# Patient Record
Sex: Female | Born: 1945 | Race: White | Hispanic: No | Marital: Married | Smoking: Former smoker
Health system: Southern US, Community
[De-identification: ages and names within clinical notes are randomized; demographics above are authoritative.]

## PROBLEM LIST (undated history)

## (undated) DIAGNOSIS — F329 Major depressive disorder, single episode, unspecified: Secondary | ICD-10-CM

## (undated) DIAGNOSIS — Z970 Presence of artificial eye: Secondary | ICD-10-CM

## (undated) DIAGNOSIS — R05 Cough: Secondary | ICD-10-CM

## (undated) DIAGNOSIS — F32A Depression, unspecified: Secondary | ICD-10-CM

## (undated) DIAGNOSIS — Z87442 Personal history of urinary calculi: Secondary | ICD-10-CM

## (undated) DIAGNOSIS — J302 Other seasonal allergic rhinitis: Secondary | ICD-10-CM

## (undated) DIAGNOSIS — R058 Other specified cough: Secondary | ICD-10-CM

## (undated) HISTORY — PX: TONSILLECTOMY: SUR1361

## (undated) HISTORY — DX: Major depressive disorder, single episode, unspecified: F32.9

## (undated) HISTORY — PX: LITHOTRIPSY: SUR834

## (undated) HISTORY — PX: PARTIAL HYSTERECTOMY: SHX80

## (undated) HISTORY — PX: CATARACT EXTRACTION: SUR2

## (undated) HISTORY — DX: Depression, unspecified: F32.A

## (undated) HISTORY — PX: EYE SURGERY: SHX253

---

## 1998-07-09 ENCOUNTER — Encounter: Payer: Self-pay | Admitting: Urology

## 1998-07-09 ENCOUNTER — Ambulatory Visit (HOSPITAL_BASED_OUTPATIENT_CLINIC_OR_DEPARTMENT_OTHER): Admission: RE | Admit: 1998-07-09 | Discharge: 1998-07-09 | Payer: Self-pay | Admitting: Urology

## 1998-09-22 ENCOUNTER — Encounter: Payer: Self-pay | Admitting: Gastroenterology

## 1998-09-22 ENCOUNTER — Ambulatory Visit (HOSPITAL_COMMUNITY): Admission: RE | Admit: 1998-09-22 | Discharge: 1998-09-22 | Payer: Self-pay | Admitting: Gastroenterology

## 1999-02-17 ENCOUNTER — Encounter: Payer: Self-pay | Admitting: Urology

## 1999-02-17 ENCOUNTER — Ambulatory Visit (HOSPITAL_COMMUNITY): Admission: RE | Admit: 1999-02-17 | Discharge: 1999-02-17 | Payer: Self-pay | Admitting: Urology

## 1999-12-28 ENCOUNTER — Encounter: Payer: Self-pay | Admitting: Gynecology

## 1999-12-28 ENCOUNTER — Encounter: Admission: RE | Admit: 1999-12-28 | Discharge: 1999-12-28 | Payer: Self-pay | Admitting: Gynecology

## 2012-05-01 ENCOUNTER — Encounter (HOSPITAL_BASED_OUTPATIENT_CLINIC_OR_DEPARTMENT_OTHER): Payer: Self-pay

## 2012-05-01 ENCOUNTER — Emergency Department (HOSPITAL_BASED_OUTPATIENT_CLINIC_OR_DEPARTMENT_OTHER): Payer: No Typology Code available for payment source

## 2012-05-01 ENCOUNTER — Emergency Department (HOSPITAL_BASED_OUTPATIENT_CLINIC_OR_DEPARTMENT_OTHER)
Admission: EM | Admit: 2012-05-01 | Discharge: 2012-05-01 | Disposition: A | Discharge status: Home / Self Care | Payer: No Typology Code available for payment source | Attending: Emergency Medicine | Admitting: Emergency Medicine

## 2012-05-01 DIAGNOSIS — S161XXA Strain of muscle, fascia and tendon at neck level, initial encounter: Secondary | ICD-10-CM

## 2012-05-01 DIAGNOSIS — Z79899 Other long term (current) drug therapy: Secondary | ICD-10-CM | POA: Insufficient documentation

## 2012-05-01 DIAGNOSIS — T07XXXA Unspecified multiple injuries, initial encounter: Secondary | ICD-10-CM | POA: Insufficient documentation

## 2012-05-01 DIAGNOSIS — S139XXA Sprain of joints and ligaments of unspecified parts of neck, initial encounter: Secondary | ICD-10-CM | POA: Insufficient documentation

## 2012-05-01 DIAGNOSIS — Y9389 Activity, other specified: Secondary | ICD-10-CM | POA: Insufficient documentation

## 2012-05-01 DIAGNOSIS — J309 Allergic rhinitis, unspecified: Secondary | ICD-10-CM | POA: Insufficient documentation

## 2012-05-01 HISTORY — DX: Other seasonal allergic rhinitis: J30.2

## 2012-05-01 MED ORDER — HYDROCODONE-ACETAMINOPHEN 5-325 MG PO TABS: 2.0000 | ORAL_TABLET | ORAL | Status: DC | PRN
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
## 2012-05-01 NOTE — ED Provider Notes (Signed)
Medical screening examination/treatment/procedure(s) were performed by non-physician practitioner and as supervising physician I was immediately available for consultation/collaboration.  Doug Sou, MD 05/01/12 1650
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
## 2012-10-09 ENCOUNTER — Other Ambulatory Visit: Payer: Self-pay | Admitting: Internal Medicine

## 2012-10-09 DIAGNOSIS — M545 Low back pain, unspecified: Secondary | ICD-10-CM

## 2012-10-16 ENCOUNTER — Ambulatory Visit
Admission: RE | Admit: 2012-10-16 | Discharge: 2012-10-16 | Disposition: A | Discharge status: Home / Self Care | Payer: Self-pay | Source: Ambulatory Visit | Attending: Internal Medicine | Admitting: Internal Medicine

## 2012-10-16 DIAGNOSIS — M545 Low back pain: Secondary | ICD-10-CM

## 2012-11-14 ENCOUNTER — Other Ambulatory Visit: Payer: Self-pay | Admitting: Neurosurgery

## 2012-11-14 DIAGNOSIS — M47816 Spondylosis without myelopathy or radiculopathy, lumbar region: Secondary | ICD-10-CM

## 2012-11-21 ENCOUNTER — Ambulatory Visit
Admission: RE | Admit: 2012-11-21 | Discharge: 2012-11-21 | Disposition: A | Discharge status: Home / Self Care | Payer: Medicare Other | Source: Ambulatory Visit | Attending: Neurosurgery | Admitting: Neurosurgery

## 2012-11-21 VITALS — BP 120/59 | HR 75

## 2012-11-21 DIAGNOSIS — M47816 Spondylosis without myelopathy or radiculopathy, lumbar region: Secondary | ICD-10-CM

## 2012-11-21 MED ORDER — IOHEXOL 180 MG/ML  SOLN
1.0000 mL | Freq: Once | INTRAMUSCULAR | Status: AC | PRN
  Administered 2012-11-21: 1 mL via EPIDURAL

## 2012-11-21 MED ORDER — METHYLPREDNISOLONE ACETATE 40 MG/ML INJ SUSP (RADIOLOG
120.0000 mg | Freq: Once | INTRAMUSCULAR | Status: AC
  Administered 2012-11-21: 120 mg via EPIDURAL

## 2012-12-19 ENCOUNTER — Other Ambulatory Visit: Payer: Self-pay | Admitting: Neurosurgery

## 2012-12-19 DIAGNOSIS — M47816 Spondylosis without myelopathy or radiculopathy, lumbar region: Secondary | ICD-10-CM

## 2012-12-25 ENCOUNTER — Other Ambulatory Visit: Payer: Self-pay | Admitting: Neurosurgery

## 2012-12-25 ENCOUNTER — Ambulatory Visit
Admission: RE | Admit: 2012-12-25 | Discharge: 2012-12-25 | Disposition: A | Discharge status: Home / Self Care | Payer: Medicare Other | Source: Ambulatory Visit | Attending: Neurosurgery | Admitting: Neurosurgery

## 2012-12-25 VITALS — BP 120/70 | HR 82

## 2012-12-25 DIAGNOSIS — R839 Unspecified abnormal finding in cerebrospinal fluid: Secondary | ICD-10-CM

## 2012-12-25 DIAGNOSIS — M47816 Spondylosis without myelopathy or radiculopathy, lumbar region: Secondary | ICD-10-CM

## 2012-12-25 MED ORDER — IOHEXOL 180 MG/ML  SOLN
1.0000 mL | Freq: Once | INTRAMUSCULAR | Status: AC | PRN
  Administered 2012-12-25: 1 mL via EPIDURAL

## 2012-12-25 MED ORDER — METHYLPREDNISOLONE ACETATE 40 MG/ML INJ SUSP (RADIOLOG
120.0000 mg | Freq: Once | INTRAMUSCULAR | Status: AC
  Administered 2012-12-25: 120 mg via EPIDURAL

## 2013-03-16 ENCOUNTER — Other Ambulatory Visit: Payer: Self-pay | Admitting: Neurosurgery

## 2013-03-16 DIAGNOSIS — M4712 Other spondylosis with myelopathy, cervical region: Secondary | ICD-10-CM

## 2013-03-16 DIAGNOSIS — M47817 Spondylosis without myelopathy or radiculopathy, lumbosacral region: Secondary | ICD-10-CM

## 2013-03-20 ENCOUNTER — Ambulatory Visit
Admission: RE | Admit: 2013-03-20 | Discharge: 2013-03-20 | Disposition: A | Discharge status: Home / Self Care | Payer: Medicare Other | Source: Ambulatory Visit | Attending: Neurosurgery | Admitting: Neurosurgery

## 2013-03-20 DIAGNOSIS — M4712 Other spondylosis with myelopathy, cervical region: Secondary | ICD-10-CM

## 2013-06-12 ENCOUNTER — Encounter (HOSPITAL_COMMUNITY): Payer: Self-pay | Admitting: Pharmacy Technician

## 2013-06-12 ENCOUNTER — Other Ambulatory Visit: Payer: Self-pay | Admitting: Orthopaedic Surgery

## 2013-06-18 NOTE — Pre-Procedure Instructions (Signed)
Courtney PotashJean R Wolf  06/18/2013   Your procedure is scheduled on:  Tuesday, January 13.  [facility], Main Entrance Jul[facility] Rainier/Entrance "A" at 10:30 AM.  Call this number if you have problems the morning of surgery: [phone]   Remember:   Do not eat food or drink liquids after midnight, Monday, January 12.   Take these medicines the morning of surgery with A SIP OF WATER: fexofenadine (ALLEGRA).  Take if needed:HYDROcodone-acetaminophen (NORCO/VICODIN).   Do not wear jewelry, make-up or nail polish.  Do not wear lotions, powders, or perfumes. You may wear deodorant.  Do not shave 48 hours prior to surgery.   Do not bring valuables to the hospital.  [facility] is not responsible for any belongings or valuables.               Contacts, dentures or bridgework may not be worn into surgery.  Leave suitcase in the car. After surgery it may be brought to your room.  For patients admitted to the hospital, discharge time is determined by your treatment team.                 Special Instructions: Shower using CHG 2 nights before surgery and the night before surgery.  If you shower the day of surgery use CHG.  Use special wash - you have one bottle of CHG for all showers.  You should use approximately 1/3 of the bottle for each shower.   Please read over the following fact sheets that you were given: Pain Booklet, Coughing and Deep Breathing, Blood Transfusion Information and Surgical Site Infection Prevention

## 2013-06-19 ENCOUNTER — Encounter (HOSPITAL_COMMUNITY): Payer: Self-pay

## 2013-06-19 ENCOUNTER — Encounter (HOSPITAL_COMMUNITY)
Admission: RE | Admit: 2013-06-19 | Discharge: 2013-06-19 | Disposition: A | Discharge status: Home / Self Care | Payer: Medicare Other | Source: Ambulatory Visit | Attending: Orthopaedic Surgery | Admitting: Orthopaedic Surgery

## 2013-06-19 DIAGNOSIS — Z01818 Encounter for other preprocedural examination: Secondary | ICD-10-CM | POA: Insufficient documentation

## 2013-06-19 DIAGNOSIS — Z01811 Encounter for preprocedural respiratory examination: Secondary | ICD-10-CM | POA: Insufficient documentation

## 2013-06-19 DIAGNOSIS — Z01810 Encounter for preprocedural cardiovascular examination: Secondary | ICD-10-CM | POA: Insufficient documentation

## 2013-06-19 DIAGNOSIS — Z01812 Encounter for preprocedural laboratory examination: Secondary | ICD-10-CM | POA: Insufficient documentation

## 2013-06-19 HISTORY — DX: Personal history of urinary calculi: Z87.442

## 2013-06-19 HISTORY — DX: Cough: R05

## 2013-06-19 HISTORY — DX: Other specified cough: R05.8

## 2013-06-19 LAB — URINALYSIS, ROUTINE W REFLEX MICROSCOPIC
Bilirubin Urine: NEGATIVE
GLUCOSE, UA: NEGATIVE mg/dL
Hgb urine dipstick: NEGATIVE
KETONES UR: NEGATIVE mg/dL
LEUKOCYTES UA: NEGATIVE
Nitrite: NEGATIVE
PROTEIN: NEGATIVE mg/dL
Specific Gravity, Urine: 1.025 (ref 1.005–1.030)
Urobilinogen, UA: 1 mg/dL (ref 0.0–1.0)
pH: 6.5 (ref 5.0–8.0)

## 2013-06-19 LAB — CBC WITH DIFFERENTIAL/PLATELET
BASOS ABS: 0.1 10*3/uL (ref 0.0–0.1)
BASOS PCT: 1 % (ref 0–1)
Eosinophils Absolute: 0 10*3/uL (ref 0.0–0.7)
Eosinophils Relative: 0 % (ref 0–5)
HCT: 45.8 % (ref 36.0–46.0)
Hemoglobin: 16 g/dL — ABNORMAL HIGH (ref 12.0–15.0)
LYMPHS PCT: 15 % (ref 12–46)
Lymphs Abs: 2 10*3/uL (ref 0.7–4.0)
MCH: 31.8 pg (ref 26.0–34.0)
MCHC: 34.9 g/dL (ref 30.0–36.0)
MCV: 91.1 fL (ref 78.0–100.0)
MONOS PCT: 6 % (ref 3–12)
Monocytes Absolute: 0.8 10*3/uL (ref 0.1–1.0)
Neutro Abs: 10.2 10*3/uL — ABNORMAL HIGH (ref 1.7–7.7)
Neutrophils Relative %: 78 % — ABNORMAL HIGH (ref 43–77)
Platelets: 345 10*3/uL (ref 150–400)
RBC: 5.03 MIL/uL (ref 3.87–5.11)
RDW: 13.2 % (ref 11.5–15.5)
WBC: 13.1 10*3/uL — AB (ref 4.0–10.5)

## 2013-06-19 LAB — BASIC METABOLIC PANEL
BUN: 11 mg/dL (ref 6–23)
CO2: 21 mEq/L (ref 19–32)
Calcium: 9.3 mg/dL (ref 8.4–10.5)
Chloride: 101 mEq/L (ref 96–112)
Creatinine, Ser: 0.74 mg/dL (ref 0.50–1.10)
GFR calc non Af Amer: 85 mL/min — ABNORMAL LOW (ref >90)
Glucose, Bld: 100 mg/dL — ABNORMAL HIGH (ref 70–99)
POTASSIUM: 4.4 meq/L (ref 3.7–5.3)
SODIUM: 137 meq/L (ref 137–147)

## 2013-06-19 LAB — ABO/RH: ABO/RH(D): O POS

## 2013-06-19 LAB — PROTIME-INR
INR: 0.93 (ref 0.00–1.49)
Prothrombin Time: 12.3 seconds (ref 11.6–15.2)

## 2013-06-19 LAB — TYPE AND SCREEN
ABO/RH(D): O POS
Antibody Screen: NEGATIVE

## 2013-06-19 LAB — SURGICAL PCR SCREEN
MRSA, PCR: NEGATIVE
STAPHYLOCOCCUS AUREUS: NEGATIVE

## 2013-06-19 LAB — APTT: aPTT: 28 seconds (ref 24–37)

## 2013-06-19 NOTE — Pre-Procedure Instructions (Signed)
Ulla PotashJean R Christofferson  06/19/2013   Your procedure is scheduled on:    Tuesday  06/26/12  [facility]  2 * 3 at 1030 AM.  Call this number if you have problems the morning of surgery: [phone]   Remember:   Do not eat food or drink liquids after midnight.   Take these medicines the morning of surgery with A SIP OF WATER:  ALLEGRA, HYDROCODONE IF NEEDED   Do not wear jewelry, make-up or nail polish.  Do not wear lotions, powders, or perfumes. You may wear deodorant.  Do not shave 48 hours prior to surgery. Men may shave face and neck.  Do not bring valuables to the hospital.  [facility] is not responsible                  for any belongings or valuables.               Contacts, dentures or bridgework may not be worn into surgery.  Leave suitcase in the car. After surgery it may be brought to your room.  For patients admitted to the hospital, discharge time is determined by your                treatment team.               Patients discharged the day of surgery will not be allowed to drive  home.  Name and phone number of your driver:  Special Instructions: Shower using CHG 2 nights before surgery and the night before surgery.  If you shower the day of surgery use CHG.  Use special wash - you have one bottle of CHG for all showers.  You should use approximately 1/3 of the bottle for each shower.   Please read over the following fact sheets that you were given: Pain Booklet, Coughing and Deep Breathing, Blood Transfusion Information, Total Joint Packet, MRSA Information and Surgical Site Infection Prevention

## 2013-06-20 ENCOUNTER — Other Ambulatory Visit: Payer: Self-pay | Admitting: Orthopaedic Surgery

## 2013-06-20 NOTE — Progress Notes (Signed)
Anesthesia Chart Review:  Patient is a 68 year old female scheduled for right TKA on 06/26/13 by Dr. Jerl Santosalldorf.  History noted.  PCP is listed as Dr. Shary DecampGrisso.  EKG on 06/19/13 showed NSR.  CXR on 06/19/13 showed: No acute cardiopulmonary process. Possible nodular opacities within the left and right lung base. While these may be secondary to overlapping structures a true underlying nodule is not excluded. This can be correlated with chest CT in the nonacute setting. (I spoke with Agustin CreeKathy Blume at Dr. Nolon Nationsalldorf's office.  He plans for her to get a chest CT on POD#1 while she is in the hospital.)  Preoperative labs noted. Kathy notified of WBC of 13.1.  UA is WNL.  Anticipate that she can proceed from an anesthesia standpoint.  Velna Ochsllison Eljay Lave, PA-C [facility]Anesthesiology Phone [phone] 06/20/2013 3:30 PM

## 2013-06-22 NOTE — H&P (Signed)
TOTAL KNEE ADMISSION H&P  Patient is being admitted for right total knee arthroplasty.  Subjective:  Chief Complaint:right knee pain.  HPI: [name], 68 y.o. female, has a history of pain and functional disability in the right knee due to arthritis and has failed non-surgical conservative treatments for greater than 12 weeks to includeNSAID's and/or analgesics, corticosteriod injections, flexibility and strengthening excercises, weight reduction as appropriate and activity modification.  Onset of symptoms was gradual, starting 9 years ago with gradually worsening course since that time. The patient noted prior procedures on the knee to include  arthroscopy on the right knee(s).  Patient currently rates pain in the right knee(s) at 9 out of 10 with activity. Patient has night pain, worsening of pain with activity and weight bearing, pain that interferes with activities of daily living, pain with passive range of motion and crepitus.  Patient has evidence of subchondral sclerosis, periarticular osteophytes and joint space narrowing by imaging studies. This patient has had previous knee arthroscopy.. There is no active infection.  There are no active problems to display for this patient.  Past Medical History  Diagnosis Date  . Seasonal allergies   . Cough productive of clear sputum   . History of kidney stones     15 YRS AGO     Past Surgical History  Procedure Laterality Date  . Abdominal hysterectomy    . Knee surgery    . Eye surgery    . Tonsillectomy    . Lithotripsy      No prescriptions prior to admission   No Known Allergies  History  Substance Use Topics  . Smoking status: Former Games developermoker  . Smokeless tobacco: Never Used  . Alcohol Use: Yes     Comment: occasional    No family history on file.   Review of Systems  Constitutional: Negative.   HENT: Negative.   Eyes: Negative.   Respiratory: Negative.   Cardiovascular: Negative.   Gastrointestinal: Negative.    Genitourinary: Negative.   Musculoskeletal: Positive for joint pain.  Skin: Negative.   Neurological: Negative.   Endo/Heme/Allergies: Negative.   Psychiatric/Behavioral: Negative.     Objective:  Physical Exam  Constitutional: She appears well-developed.  HENT:  Head: Normocephalic.  Eyes: Pupils are equal, round, and reactive to light.  Neck: Normal range of motion.  Cardiovascular: Normal rate.   Respiratory: Effort normal.  GI: Soft.  Musculoskeletal:  Right knee exam: Range of motion 0-1 20.  Crepitation 1+.  Medial and lateral joint line pain.  Normal sensory motor function.  No effusion.  Neurological: She is alert.  Skin: Skin is warm.  Psychiatric: She has a normal mood and affect.    Vital signs in last 24 hours:    Labs:   Estimated body mass index is 24.58 kg/(m^2) as calculated from the following:   Height as of 05/01/12: 5\' 1"  (1.549 m).   Weight as of 05/01/12: 58.968 kg (130 lb).   Imaging Review Plain radiographs demonstrate severe degenerative joint disease of the right knee(s). The overall alignment isneutral. The bone quality appears to be excellent for age and reported activity level.  Assessment/Plan:  End stage arthritis, right knee   The patient history, physical examination, clinical judgment of the provider and imaging studies are consistent with end stage degenerative joint disease of the right knee(s) and total knee arthroplasty is deemed medically necessary. The treatment options including medical management, injection therapy arthroscopy and arthroplasty were discussed at length. The risks and benefits  of total knee arthroplasty were presented and reviewed. The risks due to aseptic loosening, infection, stiffness, patella tracking problems, thromboembolic complications and other imponderables were discussed. The patient acknowledged the explanation, agreed to proceed with the plan and consent was signed. Patient is being admitted for  inpatient treatment for surgery, pain control, PT, OT, prophylactic antibiotics, VTE prophylaxis, progressive ambulation and ADL's and discharge planning. The patient is planning to be discharged home with home health services

## 2013-06-25 MED ORDER — CEFAZOLIN SODIUM-DEXTROSE 2-3 GM-% IV SOLR
2.0000 g | INTRAVENOUS | Status: AC
  Administered 2013-06-26: 2 g via INTRAVENOUS

## 2013-06-25 MED ORDER — CHLORHEXIDINE GLUCONATE 4 % EX LIQD: 60.0000 mL | Freq: Once | CUTANEOUS | Status: DC

## 2013-06-25 NOTE — Progress Notes (Signed)
Patient notified of new arrival time of 09:15 verbalized understanding.

## 2013-06-26 ENCOUNTER — Encounter (HOSPITAL_COMMUNITY)
Admission: RE | Disposition: A | Discharge status: Home Health | Payer: Self-pay | Source: Ambulatory Visit | Attending: Orthopaedic Surgery

## 2013-06-26 ENCOUNTER — Inpatient Hospital Stay (HOSPITAL_COMMUNITY): Payer: Medicare Other | Admitting: Certified Registered Nurse Anesthetist

## 2013-06-26 ENCOUNTER — Inpatient Hospital Stay (HOSPITAL_COMMUNITY)
Admission: RE | Admit: 2013-06-26 | Discharge: 2013-06-28 | DRG: 470 | Disposition: A | Discharge status: Home Health | Payer: Medicare Other | Source: Ambulatory Visit | Attending: Orthopaedic Surgery | Admitting: Orthopaedic Surgery

## 2013-06-26 ENCOUNTER — Encounter (HOSPITAL_COMMUNITY): Payer: Medicare Other | Admitting: Vascular Surgery

## 2013-06-26 ENCOUNTER — Encounter (HOSPITAL_COMMUNITY): Payer: Self-pay | Admitting: Certified Registered Nurse Anesthetist

## 2013-06-26 DIAGNOSIS — Z87891 Personal history of nicotine dependence: Secondary | ICD-10-CM

## 2013-06-26 DIAGNOSIS — M1711 Unilateral primary osteoarthritis, right knee: Secondary | ICD-10-CM

## 2013-06-26 DIAGNOSIS — M1710 Unilateral primary osteoarthritis, unspecified knee: Principal | ICD-10-CM | POA: Diagnosis present

## 2013-06-26 DIAGNOSIS — Z96651 Presence of right artificial knee joint: Secondary | ICD-10-CM

## 2013-06-26 HISTORY — PX: TOTAL KNEE ARTHROPLASTY: SHX125

## 2013-06-26 SURGERY — ARTHROPLASTY, KNEE, TOTAL
Anesthesia: Monitor Anesthesia Care | Site: Knee | Laterality: Right

## 2013-06-26 MED ORDER — LORATADINE 10 MG PO TABS
10.0000 mg | ORAL_TABLET | Freq: Every day | ORAL | Status: DC
  Administered 2013-06-27 – 2013-06-28 (×2): 10 mg via ORAL
  Filled 2013-06-26 (×2): qty 1

## 2013-06-26 MED ORDER — PROPOFOL INFUSION 10 MG/ML OPTIME
INTRAVENOUS | Status: DC | PRN
  Administered 2013-06-26: 75 ug/kg/min via INTRAVENOUS

## 2013-06-26 MED ORDER — MIDAZOLAM HCL 5 MG/5ML IJ SOLN
INTRAMUSCULAR | Status: DC | PRN
  Administered 2013-06-26 (×2): 1 mg via INTRAVENOUS

## 2013-06-26 MED ORDER — TRANEXAMIC ACID 100 MG/ML IV SOLN
1000.0000 mg | INTRAVENOUS | Status: AC
  Administered 2013-06-26: 1000 mg via INTRAVENOUS
  Filled 2013-06-26: qty 10

## 2013-06-26 MED ORDER — BIOTIN 1000 MCG PO TABS: 1000.0000 ug | ORAL_TABLET | Freq: Every day | ORAL | Status: DC

## 2013-06-26 MED ORDER — ALUM & MAG HYDROXIDE-SIMETH 200-200-20 MG/5ML PO SUSP: 30.0000 mL | ORAL | Status: DC | PRN

## 2013-06-26 MED ORDER — ONDANSETRON HCL 4 MG/2ML IJ SOLN
INTRAMUSCULAR | Status: DC | PRN
  Administered 2013-06-26: 4 mg via INTRAVENOUS

## 2013-06-26 MED ORDER — SODIUM CHLORIDE 0.9 % IR SOLN
Status: DC | PRN
  Administered 2013-06-26: 2000 mL

## 2013-06-26 MED ORDER — LACTATED RINGERS IV SOLN
INTRAVENOUS | Status: DC
  Administered 2013-06-26 (×2): via INTRAVENOUS

## 2013-06-26 MED ORDER — OXYCODONE HCL 5 MG PO TABS
5.0000 mg | ORAL_TABLET | Freq: Once | ORAL | Status: AC | PRN
  Administered 2013-06-26: 5 mg via ORAL

## 2013-06-26 MED ORDER — PHENOL 1.4 % MT LIQD
1.0000 | OROMUCOSAL | Status: DC | PRN
Start: 2013-06-26 — End: 2013-06-28

## 2013-06-26 MED ORDER — HYDROMORPHONE HCL PF 1 MG/ML IJ SOLN
0.2500 mg | INTRAMUSCULAR | Status: DC | PRN
  Administered 2013-06-26 (×3): 0.5 mg via INTRAVENOUS

## 2013-06-26 MED ORDER — ONDANSETRON HCL 4 MG PO TABS: 4.0000 mg | ORAL_TABLET | Freq: Four times a day (QID) | ORAL | Status: DC | PRN

## 2013-06-26 MED ORDER — HYDROCODONE-ACETAMINOPHEN 5-325 MG PO TABS
1.0000 | ORAL_TABLET | ORAL | Status: DC | PRN
  Administered 2013-06-26 – 2013-06-28 (×9): 2 via ORAL
  Filled 2013-06-26 (×10): qty 2

## 2013-06-26 MED ORDER — ZOLPIDEM TARTRATE 5 MG PO TABS
5.0000 mg | ORAL_TABLET | Freq: Every day | ORAL | Status: DC
  Administered 2013-06-26 – 2013-06-27 (×2): 5 mg via ORAL
  Filled 2013-06-26 (×2): qty 1

## 2013-06-26 MED ORDER — OXYCODONE HCL 5 MG/5ML PO SOLN: 5.0000 mg | Freq: Once | ORAL | Status: AC | PRN

## 2013-06-26 MED ORDER — HYDROMORPHONE HCL PF 1 MG/ML IJ SOLN
INTRAMUSCULAR | Status: AC
  Filled 2013-06-26: qty 1

## 2013-06-26 MED ORDER — DEXTROSE IN LACTATED RINGERS 5 % IV SOLN: INTRAVENOUS | Status: DC

## 2013-06-26 MED ORDER — EPHEDRINE SULFATE 50 MG/ML IJ SOLN
INTRAMUSCULAR | Status: DC | PRN
  Administered 2013-06-26 (×2): 5 mg via INTRAVENOUS
  Administered 2013-06-26: 10 mg via INTRAVENOUS
  Administered 2013-06-26: 5 mg via INTRAVENOUS

## 2013-06-26 MED ORDER — LIDOCAINE HCL (CARDIAC) 20 MG/ML IV SOLN
INTRAVENOUS | Status: DC | PRN
  Administered 2013-06-26: 50 mg via INTRAVENOUS

## 2013-06-26 MED ORDER — GLYCOPYRROLATE 0.2 MG/ML IJ SOLN
INTRAMUSCULAR | Status: DC | PRN
  Administered 2013-06-26: 0.2 mg via INTRAVENOUS

## 2013-06-26 MED ORDER — OXYCODONE HCL 5 MG PO TABS
ORAL_TABLET | ORAL | Status: AC
  Filled 2013-06-26: qty 1

## 2013-06-26 MED ORDER — HYDROXYZINE HCL 10 MG PO TABS
10.0000 mg | ORAL_TABLET | Freq: Three times a day (TID) | ORAL | Status: DC | PRN
  Filled 2013-06-26: qty 3

## 2013-06-26 MED ORDER — VITAMIN D3 25 MCG (1000 UNIT) PO TABS
1000.0000 [IU] | ORAL_TABLET | Freq: Every day | ORAL | Status: DC
  Administered 2013-06-26 – 2013-06-28 (×3): 1000 [IU] via ORAL
  Filled 2013-06-26 (×3): qty 1

## 2013-06-26 MED ORDER — METOCLOPRAMIDE HCL 10 MG PO TABS: 5.0000 mg | ORAL_TABLET | Freq: Three times a day (TID) | ORAL | Status: DC | PRN

## 2013-06-26 MED ORDER — MIDAZOLAM HCL 2 MG/2ML IJ SOLN
INTRAMUSCULAR | Status: AC
  Administered 2013-06-26: 1 mg via INTRAVENOUS
  Filled 2013-06-26: qty 2

## 2013-06-26 MED ORDER — ACETAMINOPHEN 325 MG PO TABS: 650.0000 mg | ORAL_TABLET | Freq: Four times a day (QID) | ORAL | Status: DC | PRN

## 2013-06-26 MED ORDER — CEFAZOLIN SODIUM 1-5 GM-% IV SOLN
1.0000 g | Freq: Four times a day (QID) | INTRAVENOUS | Status: AC
  Administered 2013-06-26 – 2013-06-27 (×2): 1 g via INTRAVENOUS
  Filled 2013-06-26 (×3): qty 50

## 2013-06-26 MED ORDER — FENTANYL CITRATE 0.05 MG/ML IJ SOLN
INTRAMUSCULAR | Status: AC
  Administered 2013-06-26: 50 ug via INTRAVENOUS
  Filled 2013-06-26: qty 2

## 2013-06-26 MED ORDER — MENTHOL 3 MG MT LOZG: 1.0000 | LOZENGE | OROMUCOSAL | Status: DC | PRN

## 2013-06-26 MED ORDER — HYDROMORPHONE HCL PF 1 MG/ML IJ SOLN
0.5000 mg | INTRAMUSCULAR | Status: DC | PRN
  Administered 2013-06-26 – 2013-06-28 (×10): 0.5 mg via INTRAVENOUS
  Filled 2013-06-26 (×10): qty 1

## 2013-06-26 MED ORDER — BUPIVACAINE-EPINEPHRINE PF 0.5-1:200000 % IJ SOLN
INTRAMUSCULAR | Status: DC | PRN
  Administered 2013-06-26: 30 mL via PERINEURAL

## 2013-06-26 MED ORDER — FENTANYL CITRATE 0.05 MG/ML IJ SOLN
INTRAMUSCULAR | Status: DC | PRN
  Administered 2013-06-26: 50 ug via INTRAVENOUS
  Administered 2013-06-26: 25 ug via INTRAVENOUS
  Administered 2013-06-26: 50 ug via INTRAVENOUS
  Administered 2013-06-26: 25 ug via INTRAVENOUS
  Administered 2013-06-26 (×6): 50 ug via INTRAVENOUS
  Administered 2013-06-26 (×2): 25 ug via INTRAVENOUS

## 2013-06-26 MED ORDER — ONDANSETRON HCL 4 MG/2ML IJ SOLN: 4.0000 mg | Freq: Four times a day (QID) | INTRAMUSCULAR | Status: DC | PRN

## 2013-06-26 MED ORDER — METOCLOPRAMIDE HCL 5 MG/ML IJ SOLN: 5.0000 mg | Freq: Three times a day (TID) | INTRAMUSCULAR | Status: DC | PRN

## 2013-06-26 MED ORDER — ZOLPIDEM TARTRATE 5 MG PO TABS: 10.0000 mg | ORAL_TABLET | Freq: Every day | ORAL | Status: DC

## 2013-06-26 MED ORDER — BUPIVACAINE HCL (PF) 0.75 % IJ SOLN
INTRAMUSCULAR | Status: DC | PRN
  Administered 2013-06-26: 10 mg via INTRATHECAL

## 2013-06-26 MED ORDER — PHENYLEPHRINE HCL 10 MG/ML IJ SOLN
INTRAMUSCULAR | Status: DC | PRN
  Administered 2013-06-26: 40 ug via INTRAVENOUS
  Administered 2013-06-26 (×9): 80 ug via INTRAVENOUS

## 2013-06-26 MED ORDER — ASPIRIN EC 325 MG PO TBEC
325.0000 mg | DELAYED_RELEASE_TABLET | Freq: Two times a day (BID) | ORAL | Status: DC
  Administered 2013-06-26 – 2013-06-28 (×4): 325 mg via ORAL
  Filled 2013-06-26 (×6): qty 1

## 2013-06-26 MED ORDER — LACTATED RINGERS IV SOLN
INTRAVENOUS | Status: DC | PRN
  Administered 2013-06-26 (×3): via INTRAVENOUS

## 2013-06-26 MED ORDER — PROMETHAZINE HCL 25 MG/ML IJ SOLN
6.2500 mg | INTRAMUSCULAR | Status: DC | PRN
Start: 2013-06-26 — End: 2013-06-26

## 2013-06-26 MED ORDER — HYDROMORPHONE HCL PF 1 MG/ML IJ SOLN
INTRAMUSCULAR | Status: DC | PRN
  Administered 2013-06-26 (×2): 0.5 mg via INTRAVENOUS

## 2013-06-26 MED ORDER — ACETAMINOPHEN 650 MG RE SUPP: 650.0000 mg | Freq: Four times a day (QID) | RECTAL | Status: DC | PRN

## 2013-06-26 SURGICAL SUPPLY — 65 items
BANDAGE ELASTIC 4 VELCRO ST LF (GAUZE/BANDAGES/DRESSINGS) ×3 IMPLANT
BANDAGE ELASTIC 6 VELCRO ST LF (GAUZE/BANDAGES/DRESSINGS) ×3 IMPLANT
BANDAGE ESMARK 6X9 LF (GAUZE/BANDAGES/DRESSINGS) ×1 IMPLANT
BANDAGE GAUZE ELAST BULKY 4 IN (GAUZE/BANDAGES/DRESSINGS) ×3 IMPLANT
BLADE SAGITTAL 25.0X1.19X90 (BLADE) ×2 IMPLANT
BLADE SAGITTAL 25.0X1.19X90MM (BLADE) ×1
BLADE SURG ROTATE 9660 (MISCELLANEOUS) IMPLANT
BNDG ELASTIC 6X10 VLCR STRL LF (GAUZE/BANDAGES/DRESSINGS) ×3 IMPLANT
BNDG ESMARK 6X9 LF (GAUZE/BANDAGES/DRESSINGS) ×3
BOWL SMART MIX CTS (DISPOSABLE) ×3 IMPLANT
CAPT RP KNEE ×3 IMPLANT
CEMENT HV SMART SET (Cement) ×6 IMPLANT
CLOTH BEACON ORANGE TIMEOUT ST (SAFETY) ×3 IMPLANT
COVER SURGICAL LIGHT HANDLE (MISCELLANEOUS) ×3 IMPLANT
CUFF TOURNIQUET SINGLE 34IN LL (TOURNIQUET CUFF) ×3 IMPLANT
CUFF TOURNIQUET SINGLE 44IN (TOURNIQUET CUFF) IMPLANT
DRAPE EXTREMITY T 121X128X90 (DRAPE) ×3 IMPLANT
DRAPE PROXIMA HALF (DRAPES) ×3 IMPLANT
DRAPE U-SHAPE 47X51 STRL (DRAPES) ×3 IMPLANT
DRSG ADAPTIC 3X8 NADH LF (GAUZE/BANDAGES/DRESSINGS) ×3 IMPLANT
DRSG PAD ABDOMINAL 8X10 ST (GAUZE/BANDAGES/DRESSINGS) ×3 IMPLANT
DURAPREP 26ML APPLICATOR (WOUND CARE) ×3 IMPLANT
ELECT REM PT RETURN 9FT ADLT (ELECTROSURGICAL) ×3
ELECTRODE REM PT RTRN 9FT ADLT (ELECTROSURGICAL) ×1 IMPLANT
FACESHIELD LNG OPTICON STERILE (SAFETY) ×6 IMPLANT
GLOVE BIO SURGEON STRL SZ8.5 (GLOVE) ×3 IMPLANT
GLOVE BIOGEL PI IND STRL 8 (GLOVE) ×1 IMPLANT
GLOVE BIOGEL PI IND STRL 8.5 (GLOVE) ×1 IMPLANT
GLOVE BIOGEL PI INDICATOR 8 (GLOVE) ×2
GLOVE BIOGEL PI INDICATOR 8.5 (GLOVE) ×2
GLOVE SS BIOGEL STRL SZ 8 (GLOVE) ×1 IMPLANT
GLOVE SUPERSENSE BIOGEL SZ 8 (GLOVE) ×2
GOWN PREVENTION PLUS XLARGE (GOWN DISPOSABLE) ×3 IMPLANT
GOWN STRL NON-REIN LRG LVL3 (GOWN DISPOSABLE) ×3 IMPLANT
GOWN STRL REUS W/TWL 2XL LVL3 (GOWN DISPOSABLE) ×3 IMPLANT
HANDPIECE INTERPULSE COAX TIP (DISPOSABLE) ×2
HOOD PEEL AWAY FACE SHEILD DIS (HOOD) ×3 IMPLANT
IMMOBILIZER KNEE 20 (SOFTGOODS)
IMMOBILIZER KNEE 20 THIGH 36 (SOFTGOODS) IMPLANT
IMMOBILIZER KNEE 22 UNIV (SOFTGOODS) ×3 IMPLANT
IMMOBILIZER KNEE 24 THIGH 36 (MISCELLANEOUS) IMPLANT
IMMOBILIZER KNEE 24 UNIV (MISCELLANEOUS)
KIT BASIN OR (CUSTOM PROCEDURE TRAY) ×3 IMPLANT
KIT ROOM TURNOVER OR (KITS) ×3 IMPLANT
MANIFOLD NEPTUNE II (INSTRUMENTS) ×3 IMPLANT
NEEDLE HYPO 21X1 ECLIPSE (NEEDLE) ×3 IMPLANT
NS IRRIG 1000ML POUR BTL (IV SOLUTION) ×3 IMPLANT
PACK TOTAL JOINT (CUSTOM PROCEDURE TRAY) ×3 IMPLANT
PAD ARMBOARD 7.5X6 YLW CONV (MISCELLANEOUS) ×6 IMPLANT
SET HNDPC FAN SPRY TIP SCT (DISPOSABLE) ×1 IMPLANT
SPONGE GAUZE 4X4 12PLY (GAUZE/BANDAGES/DRESSINGS) ×3 IMPLANT
SPONGE GAUZE 4X4 12PLY STER LF (GAUZE/BANDAGES/DRESSINGS) ×3 IMPLANT
STAPLER VISISTAT 35W (STAPLE) IMPLANT
SUCTION FRAZIER TIP 10 FR DISP (SUCTIONS) IMPLANT
SUT MNCRL AB 3-0 PS2 18 (SUTURE) IMPLANT
SUT VIC AB 0 CT1 27 (SUTURE) ×4
SUT VIC AB 0 CT1 27XBRD ANBCTR (SUTURE) ×2 IMPLANT
SUT VIC AB 2-0 CT1 27 (SUTURE) ×4
SUT VIC AB 2-0 CT1 TAPERPNT 27 (SUTURE) ×2 IMPLANT
SUT VLOC 180 0 24IN GS25 (SUTURE) ×3 IMPLANT
SYR 50ML LL SCALE MARK (SYRINGE) ×3 IMPLANT
TOWEL OR 17X24 6PK STRL BLUE (TOWEL DISPOSABLE) ×3 IMPLANT
TOWEL OR 17X26 10 PK STRL BLUE (TOWEL DISPOSABLE) ×3 IMPLANT
TRAY FOLEY CATH 14FR (SET/KITS/TRAYS/PACK) ×3 IMPLANT
WATER STERILE IRR 1000ML POUR (IV SOLUTION) ×6 IMPLANT

## 2013-06-26 NOTE — Anesthesia Preprocedure Evaluation (Addendum)
Anesthesia Evaluation  Patient identified by MRN, date of birth, ID band Patient awake  General Assessment Comment:Healthy  Reviewed: Allergy & Precautions, H&P , NPO status , Patient's Chart, lab work & pertinent test results  Airway Mallampati: I  Neck ROM: Full    Dental  (+) Poor Dentition and Lower Dentures   Pulmonary former smoker,  breath sounds clear to auscultation        Cardiovascular Rhythm:Regular Rate:Normal     Neuro/Psych    GI/Hepatic   Endo/Other    Renal/GU      Musculoskeletal   Abdominal   Peds  Hematology   Anesthesia Other Findings   Reproductive/Obstetrics                         Anesthesia Physical Anesthesia Plan  ASA: I  Anesthesia Plan: Spinal   Post-op Pain Management: MAC Combined w/ Regional for Post-op pain   Induction: Intravenous  Airway Management Planned: Simple Face Mask and Natural Airway  Additional Equipment:   Intra-op Plan:   Post-operative Plan:   Informed Consent:   Dental advisory given  Plan Discussed with: CRNA and Surgeon  Anesthesia Plan Comments:        Anesthesia Quick Evaluation

## 2013-06-26 NOTE — Progress Notes (Signed)
Utilization review completed.  

## 2013-06-26 NOTE — Anesthesia Procedure Notes (Addendum)
Anesthesia Regional Block:  Femoral nerve block  Pre-Anesthetic Checklist: ,, timeout performed, Correct Patient, Correct Site, Correct Laterality, Correct Procedure, Correct Position, site marked, Risks and benefits discussed, at surgeon's request and post-op pain management  Laterality: Right and Upper  Prep: chloraprep       Needles:  Injection technique: Single-shot  Needle Type: Echogenic Needle      Needle Gauge: 22 and 22 G  Needle insertion depth: 6 cm   Additional Needles:  Procedures: ultrasound guided (picture in chart) and nerve stimulator Femoral nerve block  Nerve Stimulator or Paresthesia:  Response: Twitch elicited, 0.8 mA,   Additional Responses:   Narrative:  Start time: 06/26/2013 10:15 AM End time: 06/26/2013 10:30 AM Injection made incrementally with aspirations every 5 mL.  Performed by: Personally  Anesthesiologist: Alma FriendlyJ T Alyssha Housh, MD  Additional Notes: BP cuff, EKG monitors applied. Sedation begun. Femoral artery palpated for location of nerve. After nerve location anesthetic injected incrementally, slowly , and after neg aspirations. Tolerated well.   Spinal  Patient location during procedure: OR Start time: 06/26/2013 11:50 AM End time: 06/26/2013 11:55 AM Staffing Anesthesiologist: Larayah Clute Performed by: anesthesiologist  Preanesthetic Checklist Completed: patient identified, site marked, surgical consent, pre-op evaluation, timeout performed, IV checked and risks and benefits discussed Spinal Block Patient position: sitting Prep: Betadine Approach: midline Location: L3-4 Injection technique: single-shot Needle Needle type: Tuohy  Needle gauge: 25 G Needle length: 5 cm Needle insertion depth: 2 cm Assessment Sensory level: T8 Additional Notes Tolerated well

## 2013-06-26 NOTE — Progress Notes (Signed)
Orthopedic Tech Progress Note Patient Details:  Ulla PotashJean R Hazzard 09/19/1945 [id number]  CPM Right Knee CPM Right Knee: On Right Knee Flexion (Degrees): 60 Right Knee Extension (Degrees): 0 Additional Comments: will provide ohf when one becomes available; rn notified   Nikki DomCrawford, Mahkai Fangman 06/26/2013, 2:38 PM

## 2013-06-26 NOTE — Progress Notes (Signed)
PHARMACIST - PHYSICIAN ORDER COMMUNICATION  CONCERNING: P&T Medication Policy on Herbal Medications  DESCRIPTION:  This patient's order for:  BIOTIN  has been noted.  This product(s) is classified as an "herbal" or natural product. Due to a lack of definitive safety studies or FDA approval, nonstandard manufacturing practices, plus the potential risk of unknown drug-drug interactions while on inpatient medications, the Pharmacy and Therapeutics Committee does not permit the use of "herbal" or natural products of this type within [facility].   ACTION TAKEN: The pharmacy department is unable to verify this order at this time.  The order has been discontinued. Please reevaluate patient's clinical condition at discharge and address if the herbal or natural product(s) should be resumed at that time.   Nicolette Bangheresa Navia Lindahl, RPh Pager: [phone] 06/26/2013 6:01 PM

## 2013-06-26 NOTE — Interval H&P Note (Signed)
History and Physical Interval Note:  06/26/2013 11:34 AM  Courtney PotashJean R Kincannon  has presented today for surgery, with the diagnosis of RIGHT KNEE DEGENEATIVE JOINT DISEASE  The various methods of treatment have been discussed with the patient and family. After consideration of risks, benefits and other options for treatment, the patient has consented to  Procedure(s): TOTAL KNEE ARTHROPLASTY (Right) as a surgical intervention .  The patient's history has been reviewed, patient examined, no change in status, stable for surgery.  I have reviewed the patient's chart and labs.  Questions were answered to the patient's satisfaction.     Rilley Stash G

## 2013-06-26 NOTE — Op Note (Signed)
PREOP DIAGNOSIS: DJD RIGHT KNEE POSTOP DIAGNOSIS: same PROCEDURE: RIGHT TKR ANESTHESIA: Spinal and block ATTENDING SURGEON: Kerria Sapien G ASSISTANT: Dirk Dress OPA and April Green RNFA  INDICATIONS FOR PROCEDURE: [name] is a 68 y.o. female who has struggled for a long time with pain due to degenerative arthritis of the right knee.  The patient has failed many conservative non-operative measures and at this point has pain which limits the ability to sleep and walk.  The patient is offered total knee replacement.  Informed operative consent was obtained after discussion of possible risks of anesthesia, infection, neurovascular injury, DVT, and death.  The importance of the post-operative rehabilitation protocol to optimize result was stressed extensively with the patient.  SUMMARY OF FINDINGS AND PROCEDURE:  [name] was taken to the operative suite where under the above anesthesia a right knee replacement was performed.  There were advanced degenerative changes and the bone quality was fair.  We used the DePuy system and placed size medium femur, 2 tibia, 35 mm all polyethylene patella, and a size 12.5 mm spacer.  The patient was admitted for appropriate post-op care to include perioperative antibiotics and mechanical and pharmacologic measures for DVT prophylaxis.  DESCRIPTION OF PROCEDURE:  [name] was taken to the operative suite where the above anesthesia was applied.  The patient was positioned supine and prepped and draped in normal sterile fashion.  An appropriate time out was performed.  After the administration of Kefzol pre-op antibiotic the leg was elevated and exsanguinated and a tourniquet inflated. A standard longitudinal incision was made on the anterior knee.  Dissection was carried down to the extensor mechanism.  All appropriate anti-infective measures were used including the pre-operative antibiotic, betadine impregnated drape, and closed hooded exhaust  systems for each member of the surgical team.  A medial parapatellar incision was made in the extensor mechanism and the knee cap flipped and the knee flexed.  Some residual meniscal tissues were removed along with any remaining ACL/PCL tissue.  A guide was placed on the tibia and a flat cut was made on it's superior surface.  An intramedullary guide was placed in the femur and was utilized to make anterior and posterior cuts creating an appropriate flexion gap.  A second intramedullary guide was placed in the femur to make a distal cut properly balancing the knee with an extension gap equal to the flexion gap.  The three bones sized to the above mentioned sizes and the appropriate guides were placed and utilized.  A trial reduction was done and the knee easily came to full extension and the patella tracked well on flexion.  The trial components were removed and all bones were cleaned with pulsatile lavage and then dried thoroughly.  Cement was mixed and was pressurized onto the bones followed by placement of the aforementioned components.  Excess cement was trimmed and pressure was held on the components until the cement had hardened.  The tourniquet was deflated and a small amount of bleeding was controlled with cautery and pressure.  The knee was irrigated thoroughly.  The extensor mechanism was re-approximated with V-loc suture in running fashion.  The knee was flexed and the repair was solid.  The subcutaneous tissues were re-approximated with #0 and #2-0 vicryl and the skin closed with a subcuticular stitch and steristrips.  A sterile dressing was applied.  Intraoperative fluids, EBL, and tourniquet time can be obtained from anesthesia records.  DISPOSITION:  The patient was taken to recovery room  in stable condition and admitted for appropriate post-op care to include peri-operative antibiotic and DVT prophylaxis with mechanical and pharmacologic measures.  [name] G 06/26/2013, 1:49 PM

## 2013-06-26 NOTE — Transfer of Care (Signed)
Immediate Anesthesia Transfer of Care Note  Patient: Courtney PotashJean R Pottenger  Procedure(s) Performed: Procedure(s): TOTAL KNEE ARTHROPLASTY (Right)  Patient Location: PACU  Anesthesia Type:MAC, Regional and Spinal  Level of Consciousness: awake, alert, oriented, responds to stimulation  Airway & Oxygen Therapy: Patient Spontanous Breathing and Patient connected to nasal cannula oxygen  Post-op Assessment: Report given to PACU RN and Post -op Vital signs reviewed and stable, MAE x4  Post vital signs: Reviewed and stable  Complications: No apparent anesthesia complications

## 2013-06-26 NOTE — Preoperative (Signed)
Beta Blockers   Reason not to administer Beta Blockers:Not Applicable 

## 2013-06-27 ENCOUNTER — Encounter (HOSPITAL_COMMUNITY): Payer: Self-pay

## 2013-06-27 ENCOUNTER — Inpatient Hospital Stay (HOSPITAL_COMMUNITY): Payer: Medicare Other

## 2013-06-27 LAB — BASIC METABOLIC PANEL
BUN: 11 mg/dL (ref 6–23)
CALCIUM: 8.6 mg/dL (ref 8.4–10.5)
CO2: 23 meq/L (ref 19–32)
Chloride: 104 mEq/L (ref 96–112)
Creatinine, Ser: 0.7 mg/dL (ref 0.50–1.10)
GFR calc Af Amer: >90 mL/min (ref >90)
GFR, EST NON AFRICAN AMERICAN: 87 mL/min — AB (ref >90)
GLUCOSE: 100 mg/dL — AB (ref 70–99)
Potassium: 4.7 mEq/L (ref 3.7–5.3)
Sodium: 138 mEq/L (ref 137–147)

## 2013-06-27 LAB — CBC
HCT: 37.4 % (ref 36.0–46.0)
Hemoglobin: 13 g/dL (ref 12.0–15.0)
MCH: 31.8 pg (ref 26.0–34.0)
MCHC: 34.8 g/dL (ref 30.0–36.0)
MCV: 91.4 fL (ref 78.0–100.0)
PLATELETS: 306 10*3/uL (ref 150–400)
RBC: 4.09 MIL/uL (ref 3.87–5.11)
RDW: 13.4 % (ref 11.5–15.5)
WBC: 12 10*3/uL — ABNORMAL HIGH (ref 4.0–10.5)

## 2013-06-27 MED ORDER — METHOCARBAMOL 500 MG PO TABS
500.0000 mg | ORAL_TABLET | Freq: Four times a day (QID) | ORAL | Status: DC | PRN
  Administered 2013-06-27 – 2013-06-28 (×4): 500 mg via ORAL
  Filled 2013-06-27 (×4): qty 1

## 2013-06-27 MED ORDER — IOHEXOL 300 MG/ML  SOLN
80.0000 mL | Freq: Once | INTRAMUSCULAR | Status: AC | PRN
  Administered 2013-06-27: 80 mL via INTRAVENOUS

## 2013-06-27 NOTE — Progress Notes (Signed)
PT Cancellation Note  Patient Details Name: [name] MRN: [id number] DOB: 04/04/1946   Cancelled Treatment:    Reason Eval/Treat Not Completed: Patient at procedure or test/unavailable  Therapy will check back on patient as time permits.   Shamekia Tippets LUBECK 06/27/2013, 11:56 AM

## 2013-06-27 NOTE — Progress Notes (Signed)
Orthopedic Tech Progress Note Patient Details:  [name] 06/10/1946 [id number] On cpm at 4:10 pm RLE 0-35 patient in pain to increase as patient can tollrate.  Patient ID: [name], female   DOB: 07/21/1945, 68 y.o.   MRN: [id number]   Jennye MoccasinHughes, Jaycion Treml Craig 06/27/2013, 4:07 PM

## 2013-06-27 NOTE — Evaluation (Signed)
Occupational Therapy Evaluation Patient Details Name: [name] MRN: [id number] DOB: 22-Feb-1946 Today's Date: 06/27/2013 Time: 0900-0930 OT Time Calculation (min): 30 min  OT Assessment / Plan / Recommendation History of present illness Pt is s/p R TKA   Clinical Impression   Pt is moving well POD 1, limited by increased pain.  OT will follow for ADL transfers, to determine tub equipment needs, and standing activities.  Pt will rely on her spouse to assist with LB ADL.    OT Assessment  Patient needs continued OT Services    Follow Up Recommendations  No OT follow up;Supervision/Assistance - 24 hour    Barriers to Discharge      Equipment Recommendations       Recommendations for Other Services    Frequency  Min 2X/week    Precautions / Restrictions Precautions Precautions: Knee;Fall Restrictions Weight Bearing Restrictions: Yes RLE Weight Bearing: Weight bearing as tolerated   Pertinent Vitals/Pain 8/10 R knee, iced, RN provided meds, 96% on RA    ADL  Eating/Feeding: Independent Where Assessed - Eating/Feeding: Edge of bed Grooming: Wash/dry hands;Wash/dry face;Brushing hair;Set up Where Assessed - Grooming: Unsupported sitting Upper Body Bathing: Set up Where Assessed - Upper Body Bathing: Unsupported sitting Lower Body Bathing: Moderate assistance Where Assessed - Lower Body Bathing: Unsupported sitting;Supported sit to stand Upper Body Dressing: Set up Where Assessed - Upper Body Dressing: Unsupported sitting Lower Body Dressing: Moderate assistance Where Assessed - Lower Body Dressing: Unsupported sitting;Supported sit to stand Toilet Transfer: Minimal assistance Toilet Transfer Method: Sit to stand Toilet Transfer Equipment: Bedside commode Toileting - Clothing Manipulation and Hygiene: Supervision/safety Where Assessed - Toileting Clothing Manipulation and Hygiene: Sit on 3-in-1 or toilet Equipment Used: Gait belt;Rolling walker Transfers/Ambulation  Related to ADLs: min assist with RW, chair close due to low BP ADL Comments: Pt will rely on her husband to assist with LB ADL, not interested in AE.    OT Diagnosis: Generalized weakness;Acute pain  OT Problem List: Decreased strength;Decreased activity tolerance;Impaired balance (sitting and/or standing);Decreased knowledge of use of DME or AE;Pain OT Treatment Interventions: Self-care/ADL training;DME and/or AE instruction;Patient/family education   OT Goals(Current goals can be found in the care plan section) Acute Rehab OT Goals Patient Stated Goal: Decrease pain and go home. OT Goal Formulation: With patient Time For Goal Achievement: 07/04/13 Potential to Achieve Goals: Good ADL Goals Pt Will Perform Grooming: with supervision;standing Pt Will Transfer to Toilet: with supervision;ambulating;bedside commode (over toilet) Pt Will Perform Tub/Shower Transfer: Tub transfer;with supervision;rolling walker;ambulating (determine optimal tub equipment)  Visit Information  Last OT Received On: 06/27/13 Assistance Needed: +1 PT/OT/SLP Co-Evaluation/Treatment: Yes Reason for Co-Treatment: For patient/therapist safety PT goals addressed during session: Mobility/safety with mobility;Proper use of DME;Strengthening/ROM OT goals addressed during session: ADL's and self-care History of Present Illness: Pt is s/p R TKA       Prior Functioning     Home Living Family/patient expects to be discharged to:: Private residence Living Arrangements: Spouse/significant other Available Help at Discharge: Family;Available 24 hours/day Type of Home: House Home Access: Stairs to enter [employer] of Steps: 4 Entrance Stairs-Rails: Right;Left;Can reach both Home Layout: Laundry or work area in basement Home Equipment: Environmental consultant - 2 wheels;Bedside commode Prior Function Level of Independence: Independent 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 Dominant  Hand: Right         Vision/Perception Vision - History Patient Visual Report: No change from baseline   Cognition  Cognition Arousal/Alertness: Awake/alert Behavior During Therapy: [facility]  for tasks assessed/performed Overall Cognitive Status: Within Functional Limits for tasks assessed    Extremity/Trunk Assessment Upper Extremity Assessment Upper Extremity Assessment: Defer to OT evaluation Lower Extremity Assessment Lower Extremity Assessment: RLE deficits/detail RLE Deficits / Details: Poor quad set and AAROM R knee flexion ~45 degrees with cueing to breathe  RLE: Unable to fully assess due to pain Cervical / Trunk Assessment Cervical / Trunk Assessment: Normal     Mobility Bed Mobility Overal bed mobility: Needs Assistance Bed Mobility: Supine to Sit Supine to sit: Min assist General bed mobility comments: MIN A to help guide R LE off of bed. Transfers Overall transfer level: Needs assistance Equipment used: Rolling walker (2 wheeled) Transfers: Sit to/from Stand Sit to Stand: Min assist General transfer comment: cues for proper hand placement and technique     Exercise    Balance Balance Overall balance assessment: Needs assistance Standing balance support: Bilateral upper extremity supported Standing balance-Leahy Scale: Poor (due to pain)   End of Session OT - End of Session Activity Tolerance: Patient limited by pain Patient left: in chair;with call bell/phone within reach Nurse Communication: Mobility status;Patient requests pain meds   GO     Evern BioMayberry, [name] Lynn 06/27/2013, 10:26 AM [phone]

## 2013-06-27 NOTE — Progress Notes (Signed)
Subjective: 1 Day Post-Op Procedure(s) (LRB): TOTAL KNEE ARTHROPLASTY (Right) Complaining of pain medication helping. Plan is discharge home when cleared by therapy Thursday or Friday. Activity level:  Weightbearing as tolerated Diet tolerance:  ok Voiding:  ok Patient reports pain as 4 on 0-10 scale.    Objective: Vital signs in last 24 hours: Temp:  [97 F (36.1 C)-98.2 F (36.8 C)] 98 F (36.7 C) (01/14 0500) Pulse Rate:  [57-87] 69 (01/14 0500) Resp:  [10-21] 16 (01/14 0500) BP: (85-115)/(41-73) 85/41 mmHg (01/14 0500) SpO2:  [94 %-100 %] 95 % (01/14 0500)  Labs:  Recent Labs  06/27/13 0530  HGB 13.0    Recent Labs  06/27/13 0530  WBC 12.0*  RBC 4.09  HCT 37.4  PLT 306    Recent Labs  06/27/13 0530  NA 138  K 4.7  CL 104  CO2 23  BUN 11  CREATININE 0.70  GLUCOSE 100*  CALCIUM 8.6   No results found for this basename: LABPT, INR,  in the last 72 hours  Physical Exam:  Neurologically intact ABD soft Neurovascular intact Sensation intact distally Intact pulses distally Dorsiflexion/Plantar flexion intact Incision: dressing C/D/I No cellulitis present Compartment soft  Assessment/Plan:  1 Day Post-Op Procedure(s) (LRB): TOTAL KNEE ARTHROPLASTY (Right) Advance diet Up with therapy Plan for discharge tomorrow if cleared by therapy. Continue ASA 325 one twice a day for 2 weeks. SCDs, incentive spirometry. May change dressing later today. Preoperative chest x-ray showed density that was recommended CT scan to further evaluate this will be done today. Order has been written.    Aiyanna Awtrey R 06/27/2013, 8:47 AM

## 2013-06-27 NOTE — Progress Notes (Signed)
Physical Therapy Treatment Patient Details Name: Courtney PotashJean R Wolf MRN: [id number] DOB: 12/18/1945 Today's Date: 06/27/2013 Time: [id number] PT Time Calculation (min): 23 min  PT Assessment / Plan / Recommendation  History of Present Illness Pt is s/p R TKA   PT Comments   Pt agreeable to participate in therapy.  All activity appears to be very painful for pt but she states it has improved since AM.  Pt was able to increase ambulation distance this session.  Cont with current POC to maximize functional mobility & safety prior to d/c home.    Follow Up Recommendations  Home health PT     Does the patient have the potential to tolerate intense rehabilitation     Barriers to Discharge        Equipment Recommendations  None recommended by PT    Recommendations for Other Services    Frequency 7X/week   Progress towards PT Goals Progress towards PT goals: Progressing toward goals  Plan Current plan remains appropriate    Precautions / Restrictions Precautions Precautions: Knee;Fall Restrictions RLE Weight Bearing: Weight bearing as tolerated   Pertinent Vitals/Pain 7/10 Rt knee.  Premedicated.  Repositioned for comfort.      Mobility  Bed Mobility Overal bed mobility: Modified Independent Bed Mobility: Sit to Supine General bed mobility comments: no physical (A) needed.   Transfers Overall transfer level: Needs assistance Equipment used: Rolling walker (2 wheeled) Transfers: Sit to/from Stand Sit to Stand: Min guard General transfer comment: cues for safe hand placement, RLE postioning due to KI, & body positioning before sitting.   Ambulation/Gait Ambulation/Gait assistance: Min guard Ambulation Distance (Feet): 30 Feet Assistive device: Rolling walker (2 wheeled) Gait Pattern/deviations: Step-to pattern;Decreased step length - right;Decreased step length - left;Decreased weight shift to right;Antalgic Gait velocity: decreased Gait velocity interpretation: Below normal  speed for age/gender General Gait Details: Pt very slow & guarded due to pain.  relies heavily on RW with UE's.      Exercises Total Joint Exercises Ankle Circles/Pumps: AROM;Both;10 reps Quad Sets: AROM;Strengthening;Both;10 reps Heel Slides: AAROM;Strengthening;Right;10 reps Hip ABduction/ADduction: AAROM;Strengthening;Right;10 reps Straight Leg Raises: AAROM;Strengthening;Right;10 reps     PT Goals (current goals can now be found in the care plan section) Acute Rehab PT Goals Patient Stated Goal: Decrease pain and go home. PT Goal Formulation: With patient Time For Goal Achievement: 07/04/13 Potential to Achieve Goals: Good  Visit Information  Last PT Received On: 06/27/13 Assistance Needed: +1 PT/OT/SLP Co-Evaluation/Treatment: Yes Reason for Co-Treatment: For patient/therapist safety PT goals addressed during session: Mobility/safety with mobility;Proper use of DME;Strengthening/ROM Reason Eval/Treat Not Completed: Patient at procedure or test/unavailable History of Present Illness: Pt is s/p R TKA    Subjective Data  Patient Stated Goal: Decrease pain and go home.   Cognition  Cognition Arousal/Alertness: Awake/alert Behavior During Therapy: WFL for tasks assessed/performed Overall Cognitive Status: Within Functional Limits for tasks assessed    Balance  Balance Overall balance assessment: Needs assistance Standing balance support: Bilateral upper extremity supported Standing balance-Leahy Scale: Poor (due to pain)  End of Session PT - End of Session Equipment Utilized During Treatment: Right knee immobilizer Activity Tolerance: Patient tolerated treatment well;Patient limited by pain Patient left: in bed;with call bell/phone within reach Nurse Communication: Mobility status   GP     Lara MulchCooper, [name] 06/27/2013, 1:59 PM   Verdell FaceKelly Stormee Duda, PTA [phone] 06/27/2013

## 2013-06-27 NOTE — Evaluation (Signed)
Physical Therapy Evaluation Patient Details Name: [name] MRN: [id number] DOB: 11/09/1945 Today's Date: 06/27/2013 Time: 0850-0930 PT Time Calculation (min): 40 min  PT Assessment / Plan / Recommendation History of Present Illness  Pt is s/p R TKA  Clinical Impression  Pt is s/p TKA resulting in the deficits listed below (see PT Problem List).  Pt has the most difficulty with supine therex but moves well at MIN A level with functional mobility. Pt will benefit from skilled PT to increase their independence and safety with mobility to allow discharge to the venue listed below.      PT Assessment  Patient needs continued PT services    Follow Up Recommendations  Home health PT    Does the patient have the potential to tolerate intense rehabilitation      Barriers to Discharge        Equipment Recommendations  None recommended by PT    Recommendations for Other Services     Frequency 7X/week    Precautions / Restrictions Precautions Precautions: Knee;Fall Restrictions Weight Bearing Restrictions: Yes RLE Weight Bearing: Weight bearing as tolerated   Pertinent Vitals/Pain 8/10 R knee      Mobility  Bed Mobility Overal bed mobility: Needs Assistance Bed Mobility: Supine to Sit Supine to sit: Min assist General bed mobility comments: MIN A to help guide R LE off of bed. Transfers Overall transfer level: Needs assistance Equipment used: Rolling walker (2 wheeled) Transfers: Sit to/from Stand Sit to Stand: Min assist General transfer comment: cues for proper hand placement and technique Ambulation/Gait Ambulation/Gait assistance: Min assist Ambulation Distance (Feet): 10 Feet Assistive device: Rolling walker (2 wheeled) Gait Pattern/deviations: Step-to pattern;Decreased stance time - right;Antalgic General Gait Details: Pt needing cues to keep RW on ground rather than pick up with each step.  Cues to put heel down with R LE WB as she tends to stay on toes.     Exercises Total Joint Exercises Ankle Circles/Pumps: 10 reps;Both;Supine Quad Sets: Strengthening;Right;10 reps;Supine Heel Slides: AAROM;Right;5 reps;Supine   PT Diagnosis: Difficulty walking  PT Problem List: Decreased strength;Decreased range of motion;Decreased mobility;Decreased balance;Decreased activity tolerance;Decreased knowledge of use of DME;Pain PT Treatment Interventions: Gait training;Stair training;Functional mobility training;Therapeutic activities;Therapeutic exercise;DME instruction     PT Goals(Current goals can be found in the care plan section) Acute Rehab PT Goals Patient Stated Goal: Decrease pain and go home. PT Goal Formulation: With patient Time For Goal Achievement: 07/04/13 Potential to Achieve Goals: Good  Visit Information  Last PT Received On: 06/27/13 PT/OT/SLP Co-Evaluation/Treatment: Yes Reason for Co-Treatment: For patient/therapist safety PT goals addressed during session: Mobility/safety with mobility;Proper use of DME;Strengthening/ROM OT goals addressed during session: ADL's and self-care History of Present Illness: Pt is s/p R TKA       Prior Functioning  Home Living Family/patient expects to be discharged to:: Private residence Living Arrangements: Spouse/significant other Available Help at Discharge: Family;Available 24 hours/day Type of Home: House Home Access: Stairs to enter [employer]Entrance Stairs-Number of Steps: 4 Entrance Stairs-Rails: Right;Left;Can reach both Home Layout: Laundry or work area in basement Home Equipment: Environmental consultantWalker - 2 wheels;Bedside commode Prior Function Level of Independence: Independent 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 Dominant Hand: Right    Cognition  Cognition Arousal/Alertness: Awake/alert Behavior During Therapy: WFL for tasks assessed/performed Overall Cognitive Status: Within Functional Limits for tasks assessed    Extremity/Trunk Assessment Upper Extremity  Assessment Upper Extremity Assessment: Defer to OT evaluation Lower Extremity Assessment Lower Extremity Assessment: RLE deficits/detail RLE  Deficits / Details: Poor quad set and AAROM R knee flexion ~45 degrees with cueing to breathe  RLE: Unable to fully assess due to pain Cervical / Trunk Assessment Cervical / Trunk Assessment: Normal   Balance Balance Overall balance assessment: Needs assistance Standing balance support: Bilateral upper extremity supported Standing balance-Leahy Scale: Poor (due to pain)  End of Session PT - End of Session Equipment Utilized During Treatment: Gait belt Activity Tolerance: Patient tolerated treatment well Patient left: in chair;with call bell/phone within reach;with nursing/sitter in room Nurse Communication: Mobility status;Patient requests pain meds CPM Right Knee CPM Right Knee: On Right Knee Flexion (Degrees): 60 Right Knee Extension (Degrees): 0  GP     Lota Leamer LUBECK 06/27/2013, 10:15 AM

## 2013-06-27 NOTE — Anesthesia Postprocedure Evaluation (Signed)
  Anesthesia Post-op Note  Patient: [name]  Procedure(s) Performed: Procedure(s): TOTAL KNEE ARTHROPLASTY (Right)  Patient Location: PACU  Anesthesia Type:GA combined with regional for post-op pain  Level of Consciousness: awake, alert  and oriented  Airway and Oxygen Therapy: Patient Spontanous Breathing  Post-op Pain: mild  Post-op Assessment: Post-op Vital signs reviewed  Post-op Vital Signs: Reviewed  Complications: No apparent anesthesia complications

## 2013-06-27 NOTE — Care Management Note (Signed)
CARE MANAGEMENT NOTE 06/27/2013  Patient:  [name],[name]   Account Number:  [id number]  Date Initiated:  06/27/2013  Documentation initiated by:  Vance PeperBRADY,Jaydyn Bozzo  Subjective/Objective Assessment:   68 yr old demale s/p right total knee arthroplasty.     Action/Plan:   Patient was preoperatively setup with [facility], no changes. DME has been delivered to the patient's home.   Anticipated DC Date:  06/28/2013   Anticipated DC Plan:  HOME W HOME HEALTH SERVICES      DC Planning Services  CM consult      [facility]  DURABLE MEDICAL EQUIPMENT   Choice offered to / List presented to:  C-1 Patient      DME agency  [employer]     HH arranged  HH-2 PT      HH agency  [facility]   Status of service:  Completed, signed off Medicare Important Message given?   (If response is "NO", the following Medicare IM given date fields will be blank) Date Medicare IM given:   Date Additional Medicare IM given:    Discharge Disposition:  HOME W HOME HEALTH SERVICES

## 2013-06-28 LAB — CBC
HEMATOCRIT: 39 % (ref 36.0–46.0)
Hemoglobin: 13.5 g/dL (ref 12.0–15.0)
MCH: 32.3 pg (ref 26.0–34.0)
MCHC: 34.6 g/dL (ref 30.0–36.0)
MCV: 93.3 fL (ref 78.0–100.0)
Platelets: 280 10*3/uL (ref 150–400)
RBC: 4.18 MIL/uL (ref 3.87–5.11)
RDW: 13.3 % (ref 11.5–15.5)
WBC: 17.7 10*3/uL — AB (ref 4.0–10.5)

## 2013-06-28 MED ORDER — ASPIRIN 325 MG PO TBEC: 325.0000 mg | DELAYED_RELEASE_TABLET | Freq: Two times a day (BID) | ORAL | Status: DC

## 2013-06-28 MED ORDER — HYDROCODONE-ACETAMINOPHEN 5-325 MG PO TABS: 1.0000 | ORAL_TABLET | ORAL | Status: AC | PRN

## 2013-06-28 MED ORDER — METHOCARBAMOL 500 MG PO TABS: 500.0000 mg | ORAL_TABLET | Freq: Four times a day (QID) | ORAL | Status: DC | PRN

## 2013-06-28 NOTE — Progress Notes (Signed)
Patient provided with discharge instructions and follow up information. She is going home with HHPT set up with [facility]. Husband is at bedside and will be primary caregiver at home.

## 2013-06-28 NOTE — Discharge Summary (Signed)
Patient ID: [name] MRN: [id number] DOB/AGE: January 17, 1946 68 y.o.  Admit date: 06/26/2013 Discharge date: 06/28/2013  Admission Diagnoses:  Active Problems:   Total knee replacement status, right  preoperative chest x-ray-recommended CT scan Discharge Diagnoses:  Same Chest CT scan negative. Past Medical History  Diagnosis Date  . Seasonal allergies   . Cough productive of clear sputum   . History of kidney stones     15 YRS AGO     Surgeries: Procedure(s): TOTAL KNEE ARTHROPLASTY on 06/26/2013   Consultants:    Discharged Condition: Improved  Hospital Course: ANNJEANETTE SARWAR is an 68 y.o. female who was admitted 06/26/2013 for operative treatment of<principal problem not specified>. Patient has severe unremitting pain that affects sleep, daily activities, and work/hobbies. After pre-op clearance the patient was taken to the operating room on 06/26/2013 and underwent  Procedure(s): TOTAL KNEE ARTHROPLASTY.    Patient was given perioperative antibiotics: Anti-infectives   Start     Dose/Rate Route Frequency Ordered Stop   06/26/13 2000  ceFAZolin (ANCEF) IVPB 1 g/50 mL premix     1 g 100 mL/hr over 30 Minutes Intravenous Every 6 hours 06/26/13 1659 06/27/13 0215   06/26/13 0600  ceFAZolin (ANCEF) IVPB 2 g/50 mL premix     2 g 100 mL/hr over 30 Minutes Intravenous On call to O.R. 06/25/13 1405 06/26/13 1150       Patient was given sequential compression devices, early ambulation, and chemoprophylaxis to prevent DVT.  Patient benefited maximally from hospital stay and there were no complications.  Patient is advised to followup with her PCP for sinusitis symptoms.  Recent vital signs: Patient Vitals for the past 24 hrs:  BP Temp Temp src Pulse Resp SpO2  06/28/13 0450 109/56 mmHg 98.5 F (36.9 C) Oral 84 16 94 %  06/27/13 2013 113/55 mmHg 98.3 F (36.8 C) Oral 87 18 95 %     Recent laboratory studies:  Recent Labs  06/27/13 0530 06/28/13 0303  WBC 12.0* 17.7*   HGB 13.0 13.5  HCT 37.4 39.0  PLT 306 280  NA 138  --   K 4.7  --   CL 104  --   CO2 23  --   BUN 11  --   CREATININE 0.70  --   GLUCOSE 100*  --   CALCIUM 8.6  --      Discharge Medications:     Medication List         aspirin 325 MG EC tablet  Take 1 tablet (325 mg total) by mouth 2 (two) times daily after a meal.     Biotin 1000 MCG tablet  Take 1,000 mcg by mouth daily.     cholecalciferol 1000 UNITS tablet  Commonly known as:  VITAMIN D  Take 1,000 Units by mouth daily.     fexofenadine 30 MG tablet  Commonly known as:  ALLEGRA  Take 30 mg by mouth daily.     HYDROcodone-acetaminophen 5-325 MG per tablet  Commonly known as:  NORCO/VICODIN  Take 1-2 tablets by mouth every 4 (four) hours as needed for moderate pain (breakthrough pain).     hydrOXYzine 10 MG tablet  Commonly known as:  ATARAX/VISTARIL  Take 10-30 mg by mouth every 8 (eight) hours as needed for itching.     methocarbamol 500 MG tablet  Commonly known as:  ROBAXIN  Take 1 tablet (500 mg total) by mouth every 6 (six) hours as needed for muscle spasms.  zolpidem 10 MG tablet  Commonly known as:  AMBIEN  Take 10 mg by mouth at bedtime.        Diagnostic Studies: Dg Chest 2 View  06/19/2013   CLINICAL DATA:  Preoperative evaluation for total knee surgery. No chest complaints.  EXAM: CHEST  2 VIEW  COMPARISON:  None.  FINDINGS: Normal cardiac and mediastinal contours. No consolidative pulmonary opacities. Possible nodular opacity within the left lung base. Additionally there may be a small nodular opacity within the right lung base. No pleural effusion or pneumothorax. Regional skeleton is unremarkable.  IMPRESSION: No acute cardiopulmonary process.  Possible nodular opacities within the left and right lung base. While these may be secondary to overlapping structures a true underlying nodule is not excluded. This can be correlated with chest CT in the nonacute setting.  These results will be called  to the ordering clinician or representative by the Radiologist Assistant, and communication documented in the PACS Dashboard. .   Electronically Signed   By: Annia Beltrew  Davis M.D.   On: 06/19/2013 12:50   Ct Chest W Contrast  06/27/2013   CLINICAL DATA:  Abnormal preoperative chest x-ray.  EXAM: CT CHEST WITH CONTRAST  TECHNIQUE: Multidetector CT imaging of the chest was performed during intravenous contrast administration.  CONTRAST:  80mL OMNIPAQUE IOHEXOL 300 MG/ML  SOLN  COMPARISON:  Chest x-ray 06/19/2013.  FINDINGS: The chest wall is unremarkable. No breast masses, supraclavicular or axillary adenopathy. The thyroid gland is normal. The bony thorax is intact. No destructive bone lesions or spinal canal compromise. No significant degenerative changes in the thoracic spine.  The heart is normal in size. No pericardial effusion. No enlarged mediastinal or hilar lymph nodes. The esophagus is grossly normal. The aorta is normal in caliber. No dissection.  Examination of the lung parenchyma demonstrates no acute pulmonary findings. No worrisome pulmonary nodules or masses. There is subpleural dependent atelectasis bifid laterally. No bronchiectasis or interstitial lung disease.  There is a simple appearing hepatic cyst noted at the dome a smaller low-attenuation lesion on image number 51 is also likely a cyst. There is mild common bile duct dilatation of uncertain significance. It measures up to 11 mm in the head of the pancreas but is not completely imaged. The gallbladder is normal. Recommend correlation with liver function studies. Abdominal ultrasound may be helpful full or further evaluation and to exclude a distal common bowel duct stone or ampullary lesion. The main pancreatic duct is slightly prominent at 4 mm. There are small bilateral adrenal gland lesions also.  IMPRESSION: 1. Unremarkable CT examination of the chest. No worrisome pulmonary lesions. No mediastinal or hilar mass or adenopathy. 2. Common bile  duct dilatation of uncertain significance. Duct is not completely imaged on the study. Recommend correlation with liver function studies. MRI abdomen/ MRCP may be helpful for further evaluation. Could not exclude a distal common bile duct stone, stricture or ampullary lesion. 3. Small bilateral adrenal gland nodules. These could also be evaluated on an MRI exam.   Electronically Signed   By: Loralie ChampagneMark  Gallerani M.D.   On: 06/27/2013 12:29    Disposition: 01-Home or Self Care      Discharge Orders   Future Orders Complete By Expires   Call MD / Call 911  As directed    Comments:     If you experience chest pain or shortness of breath, CALL 911 and be transported to the hospital emergency room.  If you develope a fever above 101 F,  pus (white drainage) or increased drainage or redness at the wound, or calf pain, call your surgeon's office.   Constipation Prevention  As directed    Comments:     Drink plenty of fluids.  Prune juice may be helpful.  You may use a stool softener, such as Colace (over the counter) 100 mg twice a day.  Use MiraLax (over the counter) for constipation as needed.   Diet - low sodium heart healthy  As directed    Increase activity slowly as tolerated  As directed       Follow-up Information   Follow up with DALLDORF,PETER G, MD. Call in 2 weeks.   Specialty:  Orthopedic Surgery   Contact information:   [address] [place] [address] [phone]        Signed: Prince Rome 06/28/2013, 3:04 PM

## 2013-06-28 NOTE — Progress Notes (Signed)
Physical Therapy Treatment Patient Details Name: [name] MRN: [id number] DOB: 04/25/1946 Today's Date: 06/28/2013 Time: 2952-84131352-1420 PT Time Calculation (min): 28 min  PT Assessment / Plan / Recommendation  History of Present Illness Pt is s/p R TKA   PT Comments   Pt cont's to make progress with mobility as she was able to increase ambulation distance.  Pt having difficulty tolerating LE there-ex due to pain.     Follow Up Recommendations  Home health PT     Does the patient have the potential to tolerate intense rehabilitation     Barriers to Discharge        Equipment Recommendations  None recommended by PT    Recommendations for Other Services    Frequency 7X/week   Progress towards PT Goals Progress towards PT goals: Progressing toward goals  Plan Current plan remains appropriate    Precautions / Restrictions Precautions Precautions: Knee;Fall Restrictions RLE Weight Bearing: Weight bearing as tolerated   Pertinent Vitals/Pain 8/10 Rt knee.  RN notified for pain medication.  Repositioned for comfort.      Mobility  Bed Mobility Overal bed mobility: Needs Assistance Bed Mobility: Supine to Sit;Sit to Supine Supine to sit: Min assist;Supervision General bed mobility comments: Min (A)  to position RLE in hooking position with LLE with supine>sit & to lift RLE back into bed.   Transfers Overall transfer level: Needs assistance Equipment used: Rolling walker (2 wheeled) Transfers: Sit to/from Stand Sit to Stand: Min guard General transfer comment: cues to reinforce hand placement Ambulation/Gait Ambulation/Gait assistance: Supervision Ambulation Distance (Feet): 140 Feet Assistive device: Rolling walker (2 wheeled) Gait Pattern/deviations: Step-to pattern;Step-through pattern;Decreased step length - left;Decreased weight shift to right Gait velocity: decreased General Gait Details: cont's to require cues to increase RLE heel strike but is improving as well as  progressing to step-through gait pattern.      Exercises Total Joint Exercises Ankle Circles/Pumps: AROM;Both;10 reps Quad Sets: AROM;Strengthening;Both;10 reps Heel Slides: AAROM;Strengthening;Right;10 reps Hip ABduction/ADduction: AAROM;Strengthening;Right;10 reps Straight Leg Raises: AAROM;Strengthening;Right;10 reps Knee Flexion: AAROM;Right;5 reps;Seated Goniometric ROM: AAROM knee flexion ~55 degrees in sitting.  Appears to be more so limited by pain.       PT Goals (current goals can now be found in the care plan section) Acute Rehab PT Goals Patient Stated Goal: Decrease pain and go home. PT Goal Formulation: With patient Time For Goal Achievement: 07/04/13 Potential to Achieve Goals: Good  Visit Information  Last PT Received On: 06/28/13 Assistance Needed: +1 History of Present Illness: Pt is s/p R TKA    Subjective Data  Patient Stated Goal: Decrease pain and go home.   Cognition  Cognition Arousal/Alertness: Awake/alert Behavior During Therapy: WFL for tasks assessed/performed Overall Cognitive Status: Within Functional Limits for tasks assessed    Balance     End of Session PT - End of Session Activity Tolerance: Patient tolerated treatment well Patient left: in bed;with call bell/phone within reach Nurse Communication: Mobility status;Patient requests pain meds CPM Right Knee CPM Right Knee: Off Right Knee Flexion (Degrees): 54 Right Knee Extension (Degrees): 0 Additional Comments: completed 1 hour 20 min   GP     Lara MulchCooper, Krishiv Sandler Lynn 06/28/2013, 2:47 PM  [name], PTA [phone] 06/28/2013

## 2013-06-28 NOTE — Progress Notes (Signed)
Physical Therapy Treatment Patient Details Name: Ulla PotashJean R Gaunce MRN: [id number] DOB: 05/12/1946 Today's Date: 06/28/2013 Time: 0913-0930 PT Time Calculation (min): 17 min  PT Assessment / Plan / Recommendation  History of Present Illness Pt is s/p R TKA   PT Comments   Pt progressing with mobility/PT goals.  States pain better controlled today.  Increased ambulation distance & performed stair training.     Follow Up Recommendations  Home health PT     Does the patient have the potential to tolerate intense rehabilitation     Barriers to Discharge        Equipment Recommendations  None recommended by PT    Recommendations for Other Services    Frequency 7X/week   Progress towards PT Goals Progress towards PT goals: Progressing toward goals  Plan Current plan remains appropriate    Precautions / Restrictions Precautions Precautions: Knee;Fall Restrictions Weight Bearing Restrictions: Yes RLE Weight Bearing: Weight bearing as tolerated   Pertinent Vitals/Pain 7-8/10.  RN notified.      Mobility  Transfers Overall transfer level: Needs assistance Equipment used: Rolling walker (2 wheeled) Transfers: Sit to/from Stand Sit to Stand: Min assist General transfer comment: Min (A) to support RLE as she comes to edge of chair with encouragement to allow knee flexion.   Ambulation/Gait Ambulation/Gait assistance: Supervision Ambulation Distance (Feet): 80 Feet Assistive device: Rolling walker (2 wheeled) Gait Pattern/deviations: Step-to pattern Gait velocity: decreased General Gait Details:  encouragement to increase RLE WBing & increase heel strike.   Stairs: Yes Stair Management: One rail Right;Step to pattern;Sideways Number of Stairs: 4 General stair comments: cues for sequencing & technique.       PT Goals (current goals can now be found in the care plan section) Acute Rehab PT Goals Patient Stated Goal: Decrease pain and go home. PT Goal Formulation: With  patient Time For Goal Achievement: 07/04/13 Potential to Achieve Goals: Good  Visit Information  Last PT Received On: 06/28/13 Assistance Needed: +1 History of Present Illness: Pt is s/p R TKA    Subjective Data  Patient Stated Goal: Decrease pain and go home.   Cognition  Cognition Arousal/Alertness: Awake/alert Behavior During Therapy: WFL for tasks assessed/performed Overall Cognitive Status: Within Functional Limits for tasks assessed    Balance     End of Session PT - End of Session Activity Tolerance: Patient tolerated treatment well Patient left: Other (comment) (with OT in ortho gym) Nurse Communication: Mobility status CPM Right Knee CPM Right Knee: Off   GP     Lara MulchCooper, Vernesha Talbot Lynn 06/28/2013, 9:34 AM   Verdell FaceKelly Haytham Maher, PTA [phone] 06/28/2013

## 2013-06-28 NOTE — Progress Notes (Signed)
Subjective: 2 Days Post-Op Procedure(s) (LRB): TOTAL KNEE ARTHROPLASTY (Right) No complaints of cough or sputum production, urinary symptoms. Past therapy today and wants to go home with home therapy. White count is elevated post likely due to sinusitis. She is seeing her PCP regularly for this. Once again no respiratory or urinary symptoms and knee appears normal. Activity level:  Weightbearing as tolerated Diet tolerance:  ok Voiding:  ok Patient reports pain as 2 on 0-10 scale.    Objective: Vital signs in last 24 hours: Temp:  [98.3 F (36.8 C)-98.5 F (36.9 C)] 98.5 F (36.9 C) (01/15 0450) Pulse Rate:  [84-87] 84 (01/15 0450) Resp:  [16-18] 16 (01/15 0450) BP: (109-113)/(55-56) 109/56 mmHg (01/15 0450) SpO2:  [94 %-95 %] 94 % (01/15 0450)  Labs:  Recent Labs  06/27/13 0530 06/28/13 0303  HGB 13.0 13.5    Recent Labs  06/27/13 0530 06/28/13 0303  WBC 12.0* 17.7*  RBC 4.09 4.18  HCT 37.4 39.0  PLT 306 280    Recent Labs  06/27/13 0530  NA 138  K 4.7  CL 104  CO2 23  BUN 11  CREATININE 0.70  GLUCOSE 100*  CALCIUM 8.6   No results found for this basename: LABPT, INR,  in the last 72 hours  Physical Exam:  Neurologically intact ABD soft Neurovascular intact Sensation intact distally Intact pulses distally Dorsiflexion/Plantar flexion intact Incision: dressing C/D/I No cellulitis present Compartment soft  Assessment/Plan:  2 Days Post-Op Procedure(s) (LRB): TOTAL KNEE ARTHROPLASTY (Right) Advance diet Up with therapy D/C IV fluids Discharge home with home health    [name] R 06/28/2013, 2:55 PM

## 2013-06-28 NOTE — Progress Notes (Signed)
Occupational Therapy Treatment Patient Details Name: [name] MRN: [id number] DOB: 08/14/1945 Today's Date: 06/28/2013 Time: [id number] OT Time Calculation (min): 25 min  OT Assessment / Plan / Recommendation  History of present illness Pt is s/p R TKA   OT comments  no further acute OT needs identified. All education has been completed and the patient has no further questions. See below for any follow-up Occupational Therapy or equipment needs. OT to sign off. Thank you for referral.    Follow Up Recommendations  No OT follow up;Supervision/Assistance - 24 hour    Barriers to Discharge       Equipment Recommendations  None recommended by OT    Recommendations for Other Services    Frequency Min 2X/week   Progress towards OT Goals Progress towards OT goals: Progressing toward goals  Plan Discharge plan remains appropriate    Precautions / Restrictions Precautions Precautions: Knee;Fall Restrictions RLE Weight Bearing: Weight bearing as tolerated   Pertinent Vitals/Pain RN in room to medicate   ADL  Tub/Shower Transfer: Min guard Tub/Shower Transfer Method: Science writerAmbulating Tub/Shower Transfer Equipment:  (tub transfer with 3n1) Equipment Used: Rolling walker;Other (comment) (3n1) Transfers/Ambulation Related to ADLs: Pt ambulating with PTA [name] on arrival and returning to gym complete tub transfer. See ADLS ADL Comments: Pt used teach back to educated husband on tub transfer on his arrival. pt educated on positioning and practiced x2. pt ambulating toward room and required return to sitting in recliner due to pain and fatigue. Pt correctly navigated therapist to room. pt educated on peri care and use of wet wipes for d/c. pt provided ice pack to decr edema    OT Diagnosis:    OT Problem List:   OT Treatment Interventions:     OT Goals(current goals can now be found in the care plan section) Acute Rehab OT Goals Patient Stated Goal: Decrease pain and go home. OT Goal  Formulation: With patient Time For Goal Achievement: 07/04/13 Potential to Achieve Goals: Good ADL Goals Pt Will Perform Grooming: with supervision;standing Pt Will Transfer to Toilet: with supervision;ambulating;bedside commode Pt Will Perform Tub/Shower Transfer: Tub transfer;with supervision;rolling walker;ambulating  Visit Information  Last OT Received On: 06/28/13 Assistance Needed: +1 History of Present Illness: Pt is s/p R TKA    Subjective Data      Prior Functioning       Cognition  Cognition Arousal/Alertness: Awake/alert Behavior During Therapy: WFL for tasks assessed/performed Overall Cognitive Status: Within Functional Limits for tasks assessed    Mobility   Supine to sit: Min assist;Supervision General bed mobility comments: Min (A)  to position RLE in hooking position with LLE with supine>sit & to lift RLE back into bed.   Transfers Overall transfer level: Needs assistance Equipment used: Rolling walker (2 wheeled) Transfers: Sit to/from Stand Sit to Stand: Min guard General transfer comment: cues to reinforce hand placement       Balance    End of Session OT - End of Session Activity Tolerance: Patient tolerated treatment well Patient left: in chair;with call bell/phone within reach;with family/visitor present Nurse Communication: Mobility status;Patient requests pain meds CPM Right Knee CPM Right Knee: Off Right Knee Flexion (Degrees): 54 Right Knee Extension (Degrees): 0 Additional Comments: completed 1 hour 20 min  GO     Boone Wolf, Courtney Tippin B 06/28/2013, 3:58 PM Pager: [phone]

## 2013-06-28 NOTE — Discharge Instructions (Signed)
Home Health Physical Therapy to be provided by [facility] [phone] Continue ice and elevation. May change dressing as needed. Continue ASA 3251 pill twice a day for 2 weeks. Return to office in 2 weeks. Weightbearing as tolerated.

## 2013-10-04 ENCOUNTER — Other Ambulatory Visit: Payer: Self-pay | Admitting: Internal Medicine

## 2013-10-04 DIAGNOSIS — R9389 Abnormal findings on diagnostic imaging of other specified body structures: Secondary | ICD-10-CM

## 2013-10-11 ENCOUNTER — Ambulatory Visit
Admission: RE | Admit: 2013-10-11 | Discharge: 2013-10-11 | Disposition: A | Discharge status: Home / Self Care | Payer: Medicare Other | Source: Ambulatory Visit | Attending: Internal Medicine | Admitting: Internal Medicine

## 2013-10-11 DIAGNOSIS — R9389 Abnormal findings on diagnostic imaging of other specified body structures: Secondary | ICD-10-CM

## 2013-10-11 MED ORDER — GADOBENATE DIMEGLUMINE 529 MG/ML IV SOLN
11.0000 mL | Freq: Once | INTRAVENOUS | Status: AC | PRN
  Administered 2013-10-11: 11 mL via INTRAVENOUS

## 2013-10-15 ENCOUNTER — Encounter: Payer: Self-pay | Admitting: Physician Assistant

## 2013-10-15 ENCOUNTER — Other Ambulatory Visit: Payer: Medicare Other

## 2013-10-18 ENCOUNTER — Telehealth: Payer: Self-pay

## 2013-10-18 ENCOUNTER — Other Ambulatory Visit (INDEPENDENT_AMBULATORY_CARE_PROVIDER_SITE_OTHER): Payer: Medicare Other

## 2013-10-18 ENCOUNTER — Ambulatory Visit (INDEPENDENT_AMBULATORY_CARE_PROVIDER_SITE_OTHER)
Admission: RE | Admit: 2013-10-18 | Discharge: 2013-10-18 | Disposition: A | Discharge status: Home / Self Care | Payer: Medicare Other | Source: Ambulatory Visit | Attending: Physician Assistant | Admitting: Physician Assistant

## 2013-10-18 ENCOUNTER — Ambulatory Visit (INDEPENDENT_AMBULATORY_CARE_PROVIDER_SITE_OTHER): Payer: Medicare Other | Admitting: Physician Assistant

## 2013-10-18 ENCOUNTER — Encounter: Payer: Self-pay | Admitting: Physician Assistant

## 2013-10-18 ENCOUNTER — Encounter (HOSPITAL_COMMUNITY): Payer: Self-pay | Admitting: Pharmacy Technician

## 2013-10-18 ENCOUNTER — Other Ambulatory Visit: Payer: Self-pay

## 2013-10-18 VITALS — BP 132/84 | HR 88 | Ht 61.0 in | Wt 123.1 lb

## 2013-10-18 DIAGNOSIS — R933 Abnormal findings on diagnostic imaging of other parts of digestive tract: Secondary | ICD-10-CM

## 2013-10-18 DIAGNOSIS — R634 Abnormal weight loss: Secondary | ICD-10-CM

## 2013-10-18 DIAGNOSIS — K838 Other specified diseases of biliary tract: Secondary | ICD-10-CM

## 2013-10-18 DIAGNOSIS — R935 Abnormal findings on diagnostic imaging of other abdominal regions, including retroperitoneum: Secondary | ICD-10-CM

## 2013-10-18 LAB — URINALYSIS
Bilirubin Urine: NEGATIVE
Hgb urine dipstick: NEGATIVE
Ketones, ur: NEGATIVE
Leukocytes, UA: NEGATIVE
Nitrite: NEGATIVE
SPECIFIC GRAVITY, URINE: 1.015 (ref 1.000–1.030)
Total Protein, Urine: NEGATIVE
URINE GLUCOSE: NEGATIVE
Urobilinogen, UA: 0.2 (ref 0.0–1.0)
pH: 7.5 (ref 5.0–8.0)

## 2013-10-18 LAB — HEPATIC FUNCTION PANEL
ALT: 28 U/L (ref 0–35)
AST: 20 U/L (ref 0–37)
Albumin: 4.2 g/dL (ref 3.5–5.2)
Alkaline Phosphatase: 65 U/L (ref 39–117)
BILIRUBIN DIRECT: 0.1 mg/dL (ref 0.0–0.3)
BILIRUBIN TOTAL: 0.6 mg/dL (ref 0.2–1.2)
Total Protein: 6.9 g/dL (ref 6.0–8.3)

## 2013-10-18 NOTE — Telephone Encounter (Signed)
I agree with the above. I don't think she will need ERCP however. EUS evaluation of bile ducts, pancreas as well as good side view visualization of the periampullary region should suffice.    [name], she needs upper EUS, MAC sedation, next available EUS Thursday (not hosp week) for abnormal findings on GI films.  thanks 

## 2013-10-18 NOTE — Telephone Encounter (Signed)
error 

## 2013-10-18 NOTE — Patient Instructions (Signed)
Please go to the basement level to have your labs drawn and for a urine test. Also go to our radiology department for an x-ray, basement level. [facility] [address] [facility], 1[facility]. Go to the patient registration inside front door of hospital. Date is Monday 10-22-2013.  Arrive at 9:15 am.  Have nothing by mouth after midnight.  We will be calling you with an appointment for an ENdoscopy Ultrasound with Dr[facility] Rob Buntinganiel Jacobs [facility].  His medical assistant Alexia Freestoneatty will be calling.

## 2013-10-18 NOTE — Progress Notes (Signed)
I agree with the above. I don't think she will need ERCP however. EUS evaluation of bile ducts, pancreas as well as good side view visualization of the periampullary region should suffice.    [name], she needs upper EUS, MAC sedation, next available EUS Thursday (not hosp week) for abnormal findings on GI films.  thanks

## 2013-10-18 NOTE — Progress Notes (Signed)
Subjective:    Patient ID: [name], female    DOB: 05/19/1946, 68 y.o.   MRN: [id number]  HPI  [name] is a pleasant 68 year old white female seen remotely by Dr. Jarold MottoPatterson in 2000 and and referred by Dr. Shary DecampGrisso for further evaluation of abnormal MRI of the abdomen and MRCP. Patient generally in good health she is status post right knee replacement in January of 2015 and says that she had a lot of difficulty after her knee replacement with lack of appetite intermittent nausea and weight loss which she feels is likely secondary to the surgery itself and pain medications. She lost about 13 pounds over a 1 month period but says she's actually gained 4 pounds back over the past month. Her appetite has improved and she is taking less pain medication. She has no complaints of abdominal pain, has had some constipation, and has had intermittent heartburn indigestion and "gurgling" in her abdomen.  Patient had undergone CT scan of the chest as part of her preop  in January 2015 after an abnormal chest x-ray. The CT of the chest showed no acute pulmonary findings, no worrisome pulmonary nodules or masses she did have a simple-appearing hepatic cyst at the dome and mild common bile duct dilation of uncertain significance of the common bile duct of 11 mm in the head of the pancreas gallbladder normal main pancreatic duct slightly prominent at 4 mm and small bilateral adrenal gland nodules. After recovering from her knee surgery and being seen in followup MRI and MRCP were ordered which were done on 10/11/2013. This shows pancreatic duct upper normal to minimally dilated at 4 mm, tiny 2 mm cystic focus in the pancreatic head question branched duct ectasia, no intrahepatic biliary ductal dilation ,common bile duct 8 mm in the porta hepatis and mildly prominent nat 11mm just above the pancreatic head -tapers to 6 mm just above the ampulla. Abnormal T2 hyperintense signal identified about the ampulla ,question ampullary  duodenal diverticulum -choledochocyst less likely ,also noted equivocal hyperenhancing and of the right proximal ureter. Labs on 09/24/2013 with normal CBC and normal LFTs   Review of Systems  Constitutional: Positive for appetite change and unexpected weight change.  HENT: Negative.   Eyes: Negative.   Respiratory: Negative.   Cardiovascular: Negative.   Gastrointestinal: Positive for nausea.  Endocrine: Negative.   Genitourinary: Negative.   Musculoskeletal: Positive for arthralgias and joint swelling.  Skin: Negative.   Allergic/Immunologic: Negative.   Neurological: Negative.   Hematological: Negative.   Psychiatric/Behavioral: Negative.    Outpatient Prescriptions Prior to Visit  Medication Sig Dispense Refill  . Biotin 1000 MCG tablet Take 1,000 mcg by mouth daily.      . cholecalciferol (VITAMIN D) 1000 UNITS tablet Take 1,000 Units by mouth daily.      . fexofenadine (ALLEGRA) 30 MG tablet Take 30 mg by mouth daily.       Marland Kitchen. HYDROcodone-acetaminophen (NORCO/VICODIN) 5-325 MG per tablet Take 1-2 tablets by mouth every 4 (four) hours as needed for moderate pain (breakthrough pain).  70 tablet  0  . hydrOXYzine (ATARAX/VISTARIL) 10 MG tablet Take 10-30 mg by mouth every 8 (eight) hours as needed for itching.      . zolpidem (AMBIEN) 10 MG tablet Take 10 mg by mouth at bedtime.      Marland Kitchen. aspirin EC 325 MG EC tablet Take 1 tablet (325 mg total) by mouth 2 (two) times daily after a meal.  30 tablet  0  .  methocarbamol (ROBAXIN) 500 MG tablet Take 1 tablet (500 mg total) by mouth every 6 (six) hours as needed for muscle spasms.  50 tablet  1   No facility-administered medications prior to visit.   Allergies  Allergen Reactions  . Other     DYES  . Chloraprep One Step [Chlorhexidine Gluconate] Rash   Patient Active Problem List   Diagnosis Date Noted  . 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 Smoking status: Former Smoker     Types: Cigarettes  . Smokeless tobacco: Never Used  . Alcohol Use: Yes     Comment: occasional   family history includes Diabetes in her father; Heart disease in her father and paternal uncle; Lung cancer in her mother; Ovarian cancer in her maternal aunt; Rheum arthritis in her paternal uncle.  Objective:   Physical Exam well-developed older white female in no acute distress, accompanied by her husband, pleasant blood pressure 132/84 pulse 88 height 5 foot 1 weight 123. HEENT; nontraumatic normocephalic EOMI PERRLA sclera anicteric, Supple ;no JVD, Cardiovascular; regular rate and rhythm with S1-S2 no murmur or gallop, Pulmonary; clear bilaterally, Abdomen; soft, nontender, bowel sounds are active there is no palpable mass or hepatosplenomegaly, Rectal ;exam not done, Extremities; no clubbing cyanosis or edema skin warm and dry she is status post recent right knee replacement, Psych; mood and affect appropriate        Assessment & Plan:  #31  68 year old white female with recent anorexia and weight loss likely occurring post operative right knee replacement and in setting of narcotic use. Weight loss has stopped and she is actually gained 4 pounds #2 abnormal MRI of the abdomen and MRCP-with minimally dilated common bile duct 11 mm and abnormal signal intensity at the ampulla possibly due to diverticulum, 2 mm pancreatic head cyst #3 history of ureteral lithiasis-onset right lower quadrant and right back pain last night  Plan; Repeat hepatic panel, check UA and KUB as patient complaining of right lower quadrant pain onset last evening rule out ureteral lithiasis Schedule for upper GI to assess for duodenal diverticulum Tentatively scheduled for EUS and an ERCP with Dr. Christella Hartigan on 11/08/2013. Will ask Dr. Christella Hartigan to review her MRCP, await upper GI. Both procedures were discussed in detail with the patient and her husband and they are agreeable to proceed

## 2013-10-18 NOTE — Telephone Encounter (Signed)
EUS  See alternate note  EUS scheduled, pt instructed and medications reviewed.  Patient instructions mailed to home.  Patient to call with any questions or concerns.

## 2013-10-22 ENCOUNTER — Ambulatory Visit (HOSPITAL_COMMUNITY)
Admission: RE | Admit: 2013-10-22 | Discharge: 2013-10-22 | Disposition: A | Discharge status: Home / Self Care | Payer: Medicare Other | Source: Ambulatory Visit | Attending: Physician Assistant | Admitting: Physician Assistant

## 2013-10-22 DIAGNOSIS — K219 Gastro-esophageal reflux disease without esophagitis: Secondary | ICD-10-CM | POA: Insufficient documentation

## 2013-10-22 DIAGNOSIS — K838 Other specified diseases of biliary tract: Secondary | ICD-10-CM

## 2013-10-22 DIAGNOSIS — K5710 Diverticulosis of small intestine without perforation or abscess without bleeding: Secondary | ICD-10-CM | POA: Insufficient documentation

## 2013-10-22 DIAGNOSIS — R110 Nausea: Secondary | ICD-10-CM | POA: Insufficient documentation

## 2013-10-22 DIAGNOSIS — R634 Abnormal weight loss: Secondary | ICD-10-CM | POA: Insufficient documentation

## 2013-10-22 DIAGNOSIS — K449 Diaphragmatic hernia without obstruction or gangrene: Secondary | ICD-10-CM | POA: Insufficient documentation

## 2013-10-29 ENCOUNTER — Encounter (HOSPITAL_COMMUNITY): Payer: Self-pay | Admitting: *Deleted

## 2013-10-29 DIAGNOSIS — Z970 Presence of artificial eye: Secondary | ICD-10-CM

## 2013-10-29 HISTORY — DX: Presence of artificial eye: Z97.0

## 2013-11-08 ENCOUNTER — Encounter (HOSPITAL_COMMUNITY)
Admission: RE | Disposition: A | Discharge status: Home / Self Care | Payer: Self-pay | Source: Ambulatory Visit | Attending: Gastroenterology

## 2013-11-08 ENCOUNTER — Encounter (HOSPITAL_COMMUNITY): Payer: Medicare Other | Admitting: Anesthesiology

## 2013-11-08 ENCOUNTER — Ambulatory Visit (HOSPITAL_COMMUNITY): Payer: Medicare Other | Admitting: Anesthesiology

## 2013-11-08 ENCOUNTER — Encounter (HOSPITAL_COMMUNITY): Payer: Self-pay

## 2013-11-08 ENCOUNTER — Ambulatory Visit (HOSPITAL_COMMUNITY)
Admission: RE | Admit: 2013-11-08 | Discharge: 2013-11-08 | Disposition: A | Discharge status: Home / Self Care | Payer: Medicare Other | Source: Ambulatory Visit | Attending: Gastroenterology | Admitting: Gastroenterology

## 2013-11-08 DIAGNOSIS — Z87891 Personal history of nicotine dependence: Secondary | ICD-10-CM | POA: Insufficient documentation

## 2013-11-08 DIAGNOSIS — R630 Anorexia: Secondary | ICD-10-CM | POA: Insufficient documentation

## 2013-11-08 DIAGNOSIS — Z96659 Presence of unspecified artificial knee joint: Secondary | ICD-10-CM | POA: Insufficient documentation

## 2013-11-08 DIAGNOSIS — R933 Abnormal findings on diagnostic imaging of other parts of digestive tract: Secondary | ICD-10-CM

## 2013-11-08 DIAGNOSIS — R634 Abnormal weight loss: Secondary | ICD-10-CM | POA: Insufficient documentation

## 2013-11-08 DIAGNOSIS — K838 Other specified diseases of biliary tract: Secondary | ICD-10-CM | POA: Insufficient documentation

## 2013-11-08 DIAGNOSIS — K5710 Diverticulosis of small intestine without perforation or abscess without bleeding: Secondary | ICD-10-CM | POA: Insufficient documentation

## 2013-11-08 DIAGNOSIS — Z79899 Other long term (current) drug therapy: Secondary | ICD-10-CM | POA: Insufficient documentation

## 2013-11-08 DIAGNOSIS — Z7982 Long term (current) use of aspirin: Secondary | ICD-10-CM | POA: Insufficient documentation

## 2013-11-08 HISTORY — DX: Presence of artificial eye: Z97.0

## 2013-11-08 HISTORY — PX: EUS: SHX5427

## 2013-11-08 SURGERY — UPPER ENDOSCOPIC ULTRASOUND (EUS) LINEAR
Anesthesia: Monitor Anesthesia Care

## 2013-11-08 MED ORDER — PROPOFOL INFUSION 10 MG/ML OPTIME
INTRAVENOUS | Status: DC | PRN
  Administered 2013-11-08: 160 ug/kg/min via INTRAVENOUS

## 2013-11-08 MED ORDER — KETAMINE HCL 10 MG/ML IJ SOLN
INTRAMUSCULAR | Status: DC | PRN
  Administered 2013-11-08: 20 mg via INTRAVENOUS

## 2013-11-08 MED ORDER — LACTATED RINGERS IV SOLN
INTRAVENOUS | Status: DC | PRN
  Administered 2013-11-08: 09:00:00 via INTRAVENOUS

## 2013-11-08 MED ORDER — SODIUM CHLORIDE 0.9 % IV SOLN: INTRAVENOUS | Status: DC

## 2013-11-08 MED ORDER — LIDOCAINE HCL (CARDIAC) 20 MG/ML IV SOLN
INTRAVENOUS | Status: AC
  Filled 2013-11-08: qty 5

## 2013-11-08 MED ORDER — PROPOFOL 10 MG/ML IV BOLUS
INTRAVENOUS | Status: AC
  Filled 2013-11-08: qty 20

## 2013-11-08 MED ORDER — BUTAMBEN-TETRACAINE-BENZOCAINE 2-2-14 % EX AERO
INHALATION_SPRAY | CUTANEOUS | Status: DC | PRN
  Administered 2013-11-08: 2 via TOPICAL

## 2013-11-08 MED ORDER — MIDAZOLAM HCL 2 MG/2ML IJ SOLN
INTRAMUSCULAR | Status: AC
  Filled 2013-11-08: qty 2

## 2013-11-08 MED ORDER — LIDOCAINE HCL 1 % IJ SOLN
INTRAMUSCULAR | Status: DC | PRN
  Administered 2013-11-08: 50 mg via INTRADERMAL

## 2013-11-08 MED ORDER — MIDAZOLAM HCL 5 MG/5ML IJ SOLN
INTRAMUSCULAR | Status: DC | PRN
  Administered 2013-11-08 (×2): 1 mg via INTRAVENOUS

## 2013-11-08 NOTE — Discharge Instructions (Signed)

## 2013-11-08 NOTE — Anesthesia Postprocedure Evaluation (Signed)
  Anesthesia Post-op Note  Patient: [name]  Procedure(s) Performed: Procedure(s) (LRB): UPPER ENDOSCOPIC ULTRASOUND (EUS) LINEAR (N/A)  Patient Location: PACU  Anesthesia Type: MAC  Level of Consciousness: awake and alert   Airway and Oxygen Therapy: Patient Spontanous Breathing  Post-op Pain: mild  Post-op Assessment: Post-op Vital signs reviewed, Patient's Cardiovascular Status Stable, Respiratory Function Stable, Patent Airway and No signs of Nausea or vomiting  Last Vitals:  Filed Vitals:   11/08/13 1010  BP: 107/54  Pulse: 80  Temp:   Resp: 20    Post-op Vital Signs: stable   Complications: No apparent anesthesia complications

## 2013-11-08 NOTE — Transfer of Care (Signed)
Immediate Anesthesia Transfer of Care Note  Patient: [name]  Procedure(s) Performed: Procedure(s): UPPER ENDOSCOPIC ULTRASOUND (EUS) LINEAR (N/A)  Patient Location: PACU and Endoscopy Unit  Anesthesia Type:MAC  Level of Consciousness: awake, alert , oriented and patient cooperative  Airway & Oxygen Therapy: Patient Spontanous Breathing and Patient connected to nasal cannula oxygen  Post-op Assessment: Report given to PACU RN, Post -op Vital signs reviewed and stable and Patient moving all extremities  Post vital signs: Reviewed and stable  Complications: No apparent anesthesia complications

## 2013-11-08 NOTE — H&P (View-Only) (Signed)
Subjective:    Patient ID: Courtney PotashJean R Hanken, female    DOB: 05/19/1946, 68 y.o.   MRN: [id number]  HPI  [name] is a pleasant 68 year old white female seen remotely by Dr. Jarold MottoPatterson in 2000 and and referred by Dr. Shary DecampGrisso for further evaluation of abnormal MRI of the abdomen and MRCP. Patient generally in good health she is status post right knee replacement in January of 2015 and says that she had a lot of difficulty after her knee replacement with lack of appetite intermittent nausea and weight loss which she feels is likely secondary to the surgery itself and pain medications. She lost about 13 pounds over a 1 month period but says she's actually gained 4 pounds back over the past month. Her appetite has improved and she is taking less pain medication. She has no complaints of abdominal pain, has had some constipation, and has had intermittent heartburn indigestion and "gurgling" in her abdomen.  Patient had undergone CT scan of the chest as part of her preop  in January 2015 after an abnormal chest x-ray. The CT of the chest showed no acute pulmonary findings, no worrisome pulmonary nodules or masses she did have a simple-appearing hepatic cyst at the dome and mild common bile duct dilation of uncertain significance of the common bile duct of 11 mm in the head of the pancreas gallbladder normal main pancreatic duct slightly prominent at 4 mm and small bilateral adrenal gland nodules. After recovering from her knee surgery and being seen in followup MRI and MRCP were ordered which were done on 10/11/2013. This shows pancreatic duct upper normal to minimally dilated at 4 mm, tiny 2 mm cystic focus in the pancreatic head question branched duct ectasia, no intrahepatic biliary ductal dilation ,common bile duct 8 mm in the porta hepatis and mildly prominent nat 11mm just above the pancreatic head -tapers to 6 mm just above the ampulla. Abnormal T2 hyperintense signal identified about the ampulla ,question ampullary  duodenal diverticulum -choledochocyst less likely ,also noted equivocal hyperenhancing and of the right proximal ureter. Labs on 09/24/2013 with normal CBC and normal LFTs   Review of Systems  Constitutional: Positive for appetite change and unexpected weight change.  HENT: Negative.   Eyes: Negative.   Respiratory: Negative.   Cardiovascular: Negative.   Gastrointestinal: Positive for nausea.  Endocrine: Negative.   Genitourinary: Negative.   Musculoskeletal: Positive for arthralgias and joint swelling.  Skin: Negative.   Allergic/Immunologic: Negative.   Neurological: Negative.   Hematological: Negative.   Psychiatric/Behavioral: Negative.    Outpatient Prescriptions Prior to Visit  Medication Sig Dispense Refill  . Biotin 1000 MCG tablet Take 1,000 mcg by mouth daily.      . cholecalciferol (VITAMIN D) 1000 UNITS tablet Take 1,000 Units by mouth daily.      . fexofenadine (ALLEGRA) 30 MG tablet Take 30 mg by mouth daily.       Marland Kitchen. HYDROcodone-acetaminophen (NORCO/VICODIN) 5-325 MG per tablet Take 1-2 tablets by mouth every 4 (four) hours as needed for moderate pain (breakthrough pain).  70 tablet  0  . hydrOXYzine (ATARAX/VISTARIL) 10 MG tablet Take 10-30 mg by mouth every 8 (eight) hours as needed for itching.      . zolpidem (AMBIEN) 10 MG tablet Take 10 mg by mouth at bedtime.      Marland Kitchen. aspirin EC 325 MG EC tablet Take 1 tablet (325 mg total) by mouth 2 (two) times daily after a meal.  30 tablet  0  .  methocarbamol (ROBAXIN) 500 MG tablet Take 1 tablet (500 mg total) by mouth every 6 (six) hours as needed for muscle spasms.  50 tablet  1   No facility-administered medications prior to visit.   Allergies  Allergen Reactions  . Other     DYES  . Chloraprep One Step [Chlorhexidine Gluconate] Rash   Patient Active Problem List   Diagnosis Date Noted  . 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 Smoking status: Former Smoker     Types: Cigarettes  . Smokeless tobacco: Never Used  . Alcohol Use: Yes     Comment: occasional   family history includes Diabetes in her father; Heart disease in her father and paternal uncle; Lung cancer in her mother; Ovarian cancer in her maternal aunt; Rheum arthritis in her paternal uncle.  Objective:   Physical Exam well-developed older white female in no acute distress, accompanied by her husband, pleasant blood pressure 132/84 pulse 88 height 5 foot 1 weight 123. HEENT; nontraumatic normocephalic EOMI PERRLA sclera anicteric, Supple ;no JVD, Cardiovascular; regular rate and rhythm with S1-S2 no murmur or gallop, Pulmonary; clear bilaterally, Abdomen; soft, nontender, bowel sounds are active there is no palpable mass or hepatosplenomegaly, Rectal ;exam not done, Extremities; no clubbing cyanosis or edema skin warm and dry she is status post recent right knee replacement, Psych; mood and affect appropriate        Assessment & Plan:  #31  68 year old white female with recent anorexia and weight loss likely occurring post operative right knee replacement and in setting of narcotic use. Weight loss has stopped and she is actually gained 4 pounds #2 abnormal MRI of the abdomen and MRCP-with minimally dilated common bile duct 11 mm and abnormal signal intensity at the ampulla possibly due to diverticulum, 2 mm pancreatic head cyst #3 history of ureteral lithiasis-onset right lower quadrant and right back pain last night  Plan; Repeat hepatic panel, check UA and KUB as patient complaining of right lower quadrant pain onset last evening rule out ureteral lithiasis Schedule for upper GI to assess for duodenal diverticulum Tentatively scheduled for EUS and an ERCP with Dr. Christella Hartigan on 11/08/2013. Will ask Dr. Christella Hartigan to review her MRCP, await upper GI. Both procedures were discussed in detail with the patient and her husband and they are agreeable to proceed

## 2013-11-08 NOTE — Anesthesia Preprocedure Evaluation (Signed)
Anesthesia Evaluation  Patient identified by MRN, date of birth, ID band Patient awake  General Assessment Comment:Healthy  Reviewed: Allergy & Precautions, H&P , NPO status , Patient's Chart, lab work & pertinent test results  Airway Mallampati: I  Neck ROM: Full    Dental  (+) Poor Dentition, Lower Dentures   Pulmonary former smoker,  breath sounds clear to auscultation        Cardiovascular Rhythm:Regular Rate:Normal     Neuro/Psych    GI/Hepatic   Endo/Other    Renal/GU      Musculoskeletal   Abdominal   Peds  Hematology   Anesthesia Other Findings   Reproductive/Obstetrics                           Anesthesia Physical  Anesthesia Plan  ASA: II  Anesthesia Plan: MAC   Post-op Pain Management: MAC Combined w/ Regional for Post-op pain   Induction:   Airway Management Planned: Natural Airway and Nasal Cannula  Additional Equipment:   Intra-op Plan:   Post-operative Plan:   Informed Consent:   Dental advisory given  Plan Discussed with: CRNA and Surgeon  Anesthesia Plan Comments:         Anesthesia Quick Evaluation

## 2013-11-08 NOTE — Interval H&P Note (Signed)
History and Physical Interval Note:  11/08/2013 9:17 AM  [name]  has presented today for surgery, with the diagnosis of Nonspecific (abnormal) findings on radiological and other examination of gastrointestinal tract [793.4]  The various methods of treatment have been discussed with the patient and family. After consideration of risks, benefits and other options for treatment, the patient has consented to  Procedure(s): UPPER ENDOSCOPIC ULTRASOUND (EUS) LINEAR (N/A) as a surgical intervention .  The patient's history has been reviewed, patient examined, no change in status, stable for surgery.  I have reviewed the patient's chart and labs.  Questions were answered to the patient's satisfaction.     Rachael Fee

## 2013-11-08 NOTE — Op Note (Signed)
[facility] [address] [place], [address]   ENDOSCOPIC ULTRASOUND PROCEDURE REPORT  PATIENT: [name], [name]  MR#: [id number] BIRTHDATE: Jan 25, 1946  GENDER: Female ENDOSCOPIST: Rachael Fee, MD PROCEDURE DATE:  11/08/2013 PROCEDURE:   Upper EUS ASA CLASS:      Class III INDICATIONS:   1.  incidentally noted slightly dilated CBD, normal LFTs; never etoh abuser, + mild weight loss after recent knee surgery, death in family; no signficant abdominal pains. MEDICATIONS: MAC sedation, administered by CRNA  DESCRIPTION OF PROCEDURE:   After the risks benefits and alternatives of the procedure were  explained, informed consent was obtained. The patient was then placed in the left, lateral, decubitus postion and IV sedation was administered. Throughout the procedure, the patients blood pressure, pulse and oxygen saturations were monitored continuously.  Under direct visualization, the Pentax EUS Radial [id number]  endoscope was introduced through the mouth  and advanced to the second portion of the duodenum .  Water was used as necessary to provide an acoustic interface.  Upon completion of the imaging, water was removed and the patient was sent to the recovery room in satisfactory condition.   Endoscopic findings (with side viewing duodenoscope and radial echoendoscope): 1. 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 The major papilla was within the diverticulum, not visible from duodenal lumen.  EUS findings: 1. CBD was slightly dilated (up to 7.95mm), contained no stones.  The most distal aspect of the CBD was not visible due to air artifact from the diverticululm. 2. Main pancreatic duct was normal throughout the gland. 3. Pancreatic parenchyma was normal throughout; no masses or signs of chronic pancreatitis. 4. No peripancreatic adenopathy. 5. Gallbladder was normal. 6. Limited views of liver, spleen, portal and splenic vessels were all  normal.  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 With normal liver tests, no biliary type symptoms, she does not require further testing or surveillance imaging.   _______________________________ eSigned:  Rachael Fee, MD 11/08/2013 9:58 AM

## 2013-11-09 ENCOUNTER — Encounter (HOSPITAL_COMMUNITY): Payer: Self-pay | Admitting: Gastroenterology

## 2013-11-16 ENCOUNTER — Encounter: Payer: Self-pay | Admitting: Gastroenterology

## 2015-01-28 IMAGING — CT CT CHEST W/ CM
2 of 3 series · 15 of 36 positions shown, 18 images · IV contrast (omnipaque)
Comparison: Chest x-ray 06/19/2013.

CLINICAL DATA: Abnormal preoperative chest x-ray.

EXAM:
CT CHEST WITH CONTRAST
TECHNIQUE: Multidetector CT imaging of the chest was performed during
intravenous contrast administration.
CONTRAST:  80mL OMNIPAQUE IOHEXOL 300 MG/ML  SOLN

[Series 4: chest with · axial · 0.65mm/px · z∈[-291,-26]mm · 12 of 63 slices shown, 15 images]
[im 5/63  mediastinal]
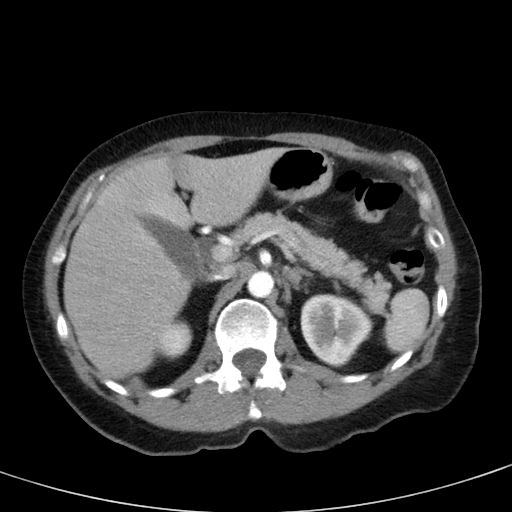
[im 5/63  lung]
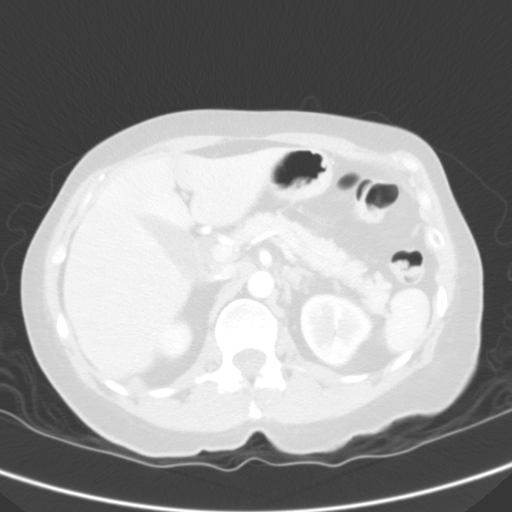
[im 10/63  lung]
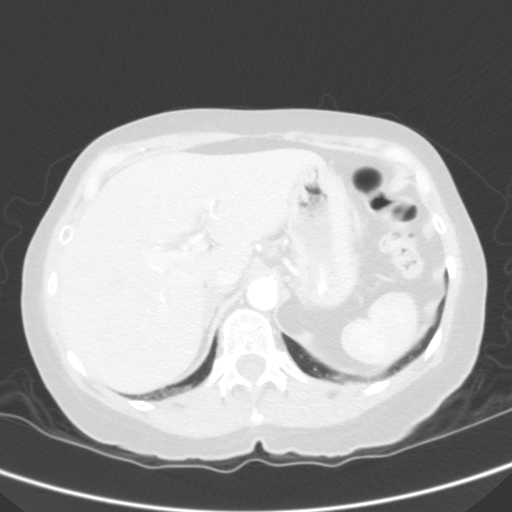
[im 14/63  lung]
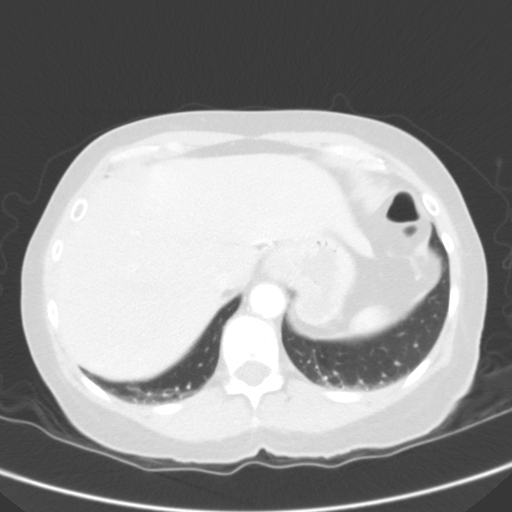
[im 19/63  lung]
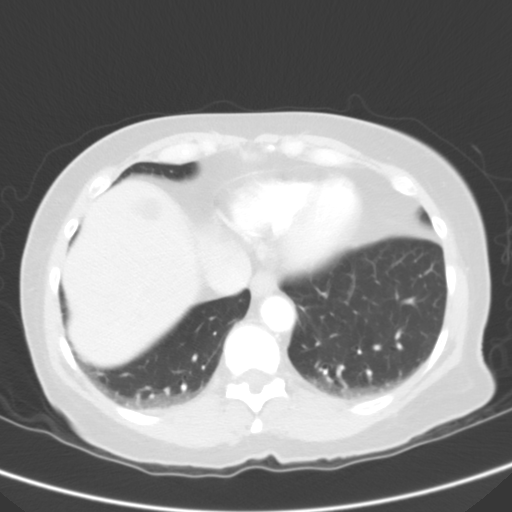
[im 23/63  mediastinal]
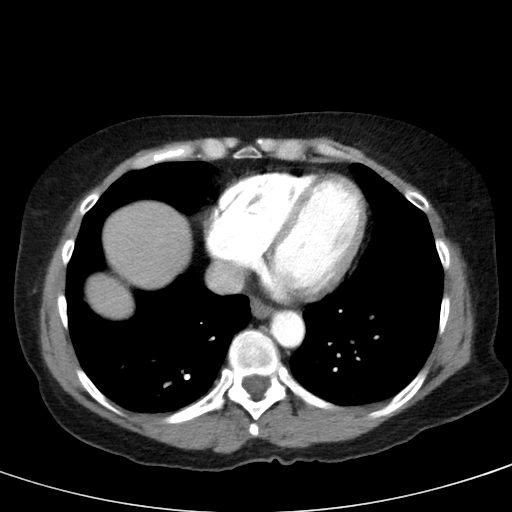
[im 23/63  lung]
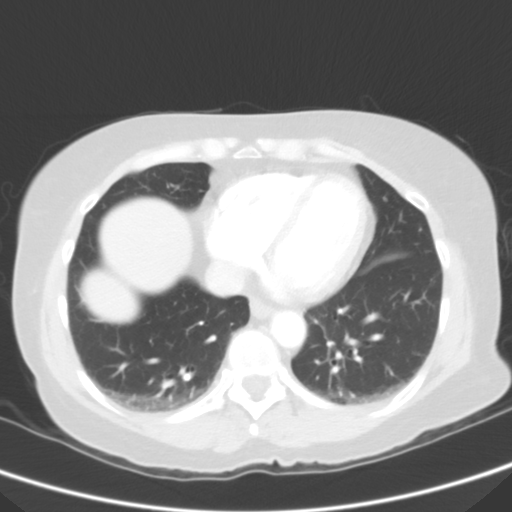
[im 28/63  lung]
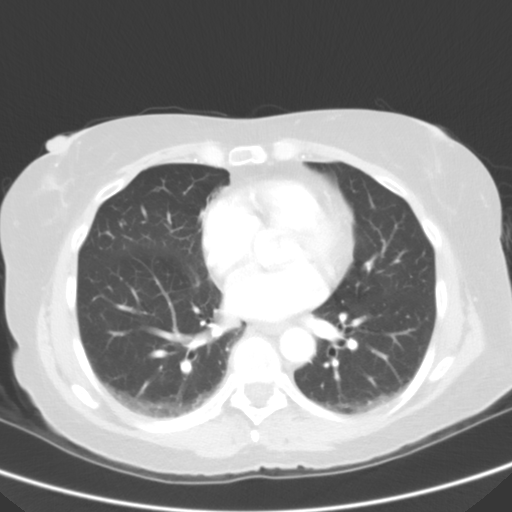
[im 35/63  lung]
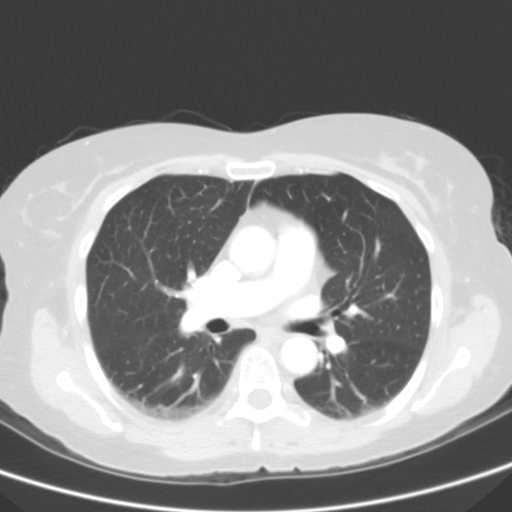
[im 40/63  lung]
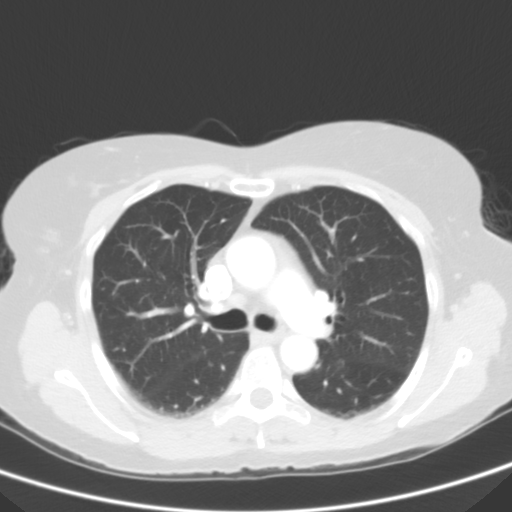
[im 44/63  mediastinal]
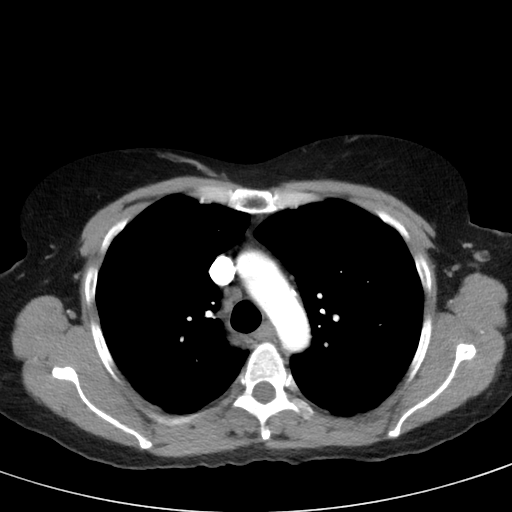
[im 44/63  lung]
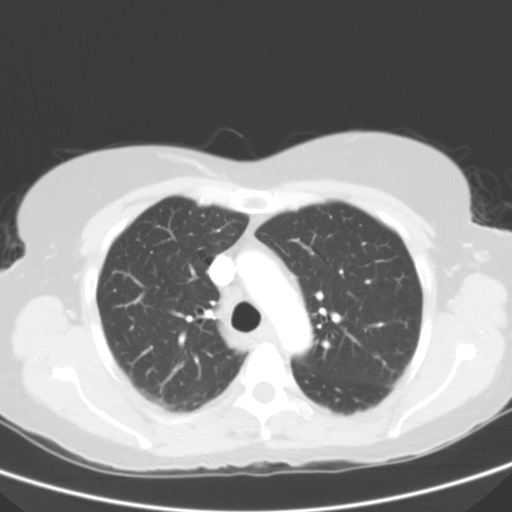
[im 49/63  lung]
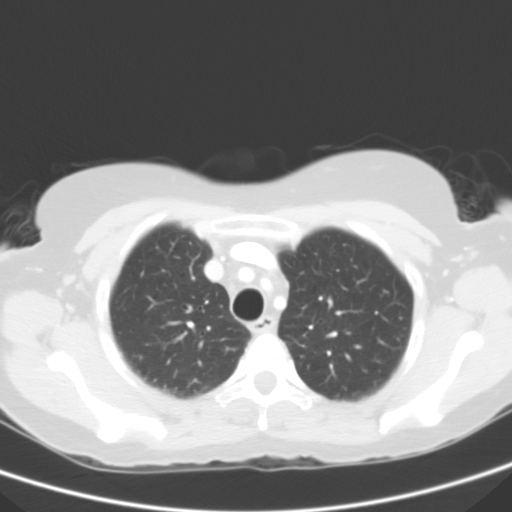
[im 53/63  lung]
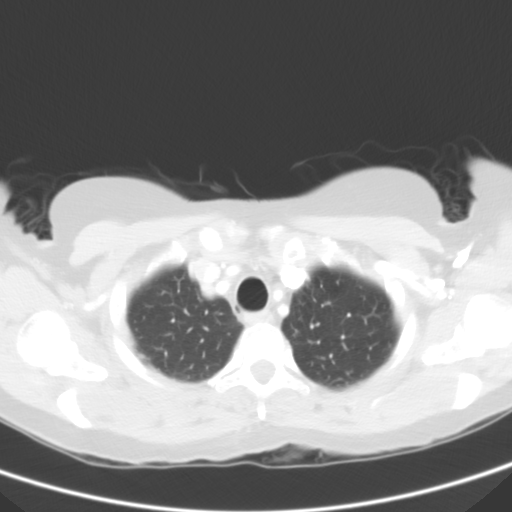
[im 58/63  lung]
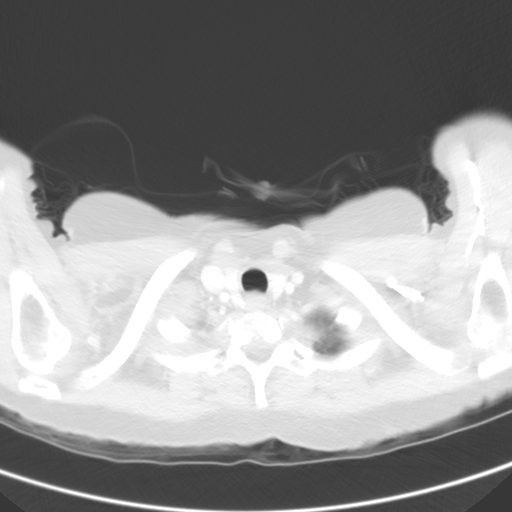

[coronal · coronal · 0.65mm/px · 3 of 81 slices shown]
[im 17/81  lung]
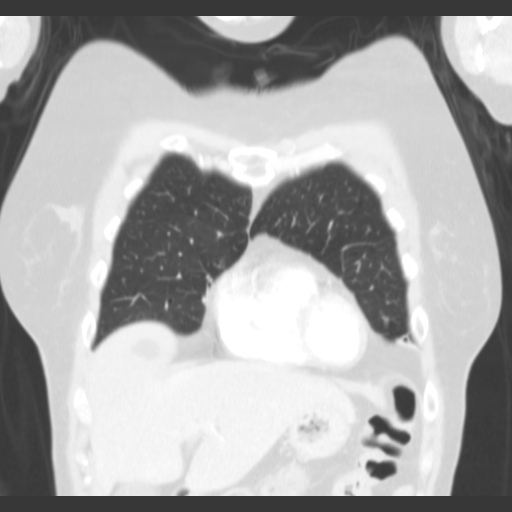
[im 33/81  lung]
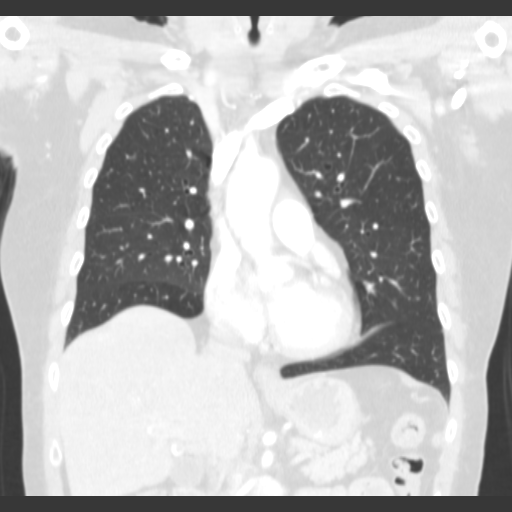
[im 49/81  lung]
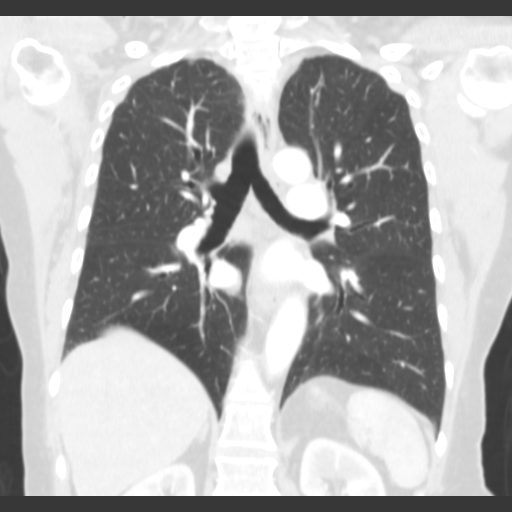

[15 of 36 positions shown; findings below may reference images not displayed]

FINDINGS: The chest wall is unremarkable. No breast masses, supraclavicular or
axillary adenopathy. The thyroid gland is normal. The bony thorax is
intact. No destructive bone lesions or spinal canal compromise. No
significant degenerative changes in the thoracic spine.

The heart is normal in size. No pericardial effusion. No enlarged
mediastinal or hilar lymph nodes. The esophagus is grossly normal.
The aorta is normal in caliber. No dissection.

Examination of the lung parenchyma demonstrates no acute pulmonary
findings. No worrisome pulmonary nodules or masses. There is
subpleural dependent atelectasis bifid laterally. No bronchiectasis
or interstitial lung disease.

There is a simple appearing hepatic cyst noted at the dome a smaller
low-attenuation lesion on image number 51 is also likely a cyst.
There is mild common bile duct dilatation of uncertain significance.
It measures up to 11 mm in the head of the pancreas but is not
completely imaged. The gallbladder is normal. Recommend correlation
with liver function studies. Abdominal ultrasound may be helpful
full or further evaluation and to exclude a distal common bowel duct
stone or ampullary lesion. The main pancreatic duct is slightly
prominent at 4 mm. There are small bilateral adrenal gland lesions
also.
IMPRESSION: 1. Unremarkable CT examination of the chest. No worrisome pulmonary
lesions. No mediastinal or hilar mass or adenopathy.
2. Common bile duct dilatation of uncertain significance. Duct is
not completely imaged on the study. Recommend correlation with liver
function studies. MRI abdomen/ MRCP may be helpful for further
evaluation. Could not exclude a distal common bile duct stone,
stricture or ampullary lesion.
3. Small bilateral adrenal gland nodules. These could also be
evaluated on an MRI exam.
